# Patient Record
Sex: Female | Born: 1935 | Race: White | Hispanic: No | Smoking: Never smoker
Health system: Southern US, Community
[De-identification: ages and names within clinical notes are randomized; demographics above are authoritative.]

## PROBLEM LIST (undated history)

## (undated) DIAGNOSIS — E119 Type 2 diabetes mellitus without complications: Secondary | ICD-10-CM

## (undated) DIAGNOSIS — Z950 Presence of cardiac pacemaker: Secondary | ICD-10-CM

## (undated) DIAGNOSIS — I10 Essential (primary) hypertension: Secondary | ICD-10-CM

## (undated) DIAGNOSIS — M109 Gout, unspecified: Secondary | ICD-10-CM

## (undated) DIAGNOSIS — E7800 Pure hypercholesterolemia, unspecified: Secondary | ICD-10-CM

## (undated) HISTORY — PX: HIP ARTHROSCOPY: SUR88

---

## 2009-02-02 ENCOUNTER — Ambulatory Visit: Payer: Self-pay | Admitting: Thoracic Surgery (Cardiothoracic Vascular Surgery)

## 2009-02-16 ENCOUNTER — Ambulatory Visit: Payer: Self-pay | Admitting: Thoracic Surgery (Cardiothoracic Vascular Surgery)

## 2009-02-23 ENCOUNTER — Encounter: Payer: Self-pay | Admitting: Thoracic Surgery (Cardiothoracic Vascular Surgery)

## 2009-02-25 ENCOUNTER — Encounter: Payer: Self-pay | Admitting: Thoracic Surgery (Cardiothoracic Vascular Surgery)

## 2009-02-25 ENCOUNTER — Ambulatory Visit: Payer: Self-pay | Admitting: Thoracic Surgery (Cardiothoracic Vascular Surgery)

## 2009-02-25 ENCOUNTER — Inpatient Hospital Stay (HOSPITAL_COMMUNITY)
Admission: RE | Admit: 2009-02-25 | Discharge: 2009-03-06 | Payer: Self-pay | Admitting: Thoracic Surgery (Cardiothoracic Vascular Surgery)

## 2009-02-25 ENCOUNTER — Ambulatory Visit: Payer: Self-pay | Admitting: Internal Medicine

## 2009-03-30 ENCOUNTER — Encounter
Admission: RE | Admit: 2009-03-30 | Discharge: 2009-03-30 | Payer: Self-pay | Admitting: Thoracic Surgery (Cardiothoracic Vascular Surgery)

## 2009-03-30 ENCOUNTER — Ambulatory Visit: Payer: Self-pay | Admitting: Thoracic Surgery (Cardiothoracic Vascular Surgery)

## 2009-07-13 ENCOUNTER — Ambulatory Visit: Payer: Self-pay | Admitting: Thoracic Surgery (Cardiothoracic Vascular Surgery)

## 2009-10-12 ENCOUNTER — Ambulatory Visit: Payer: Self-pay | Admitting: Thoracic Surgery (Cardiothoracic Vascular Surgery)

## 2010-03-08 ENCOUNTER — Ambulatory Visit: Payer: Self-pay | Admitting: Thoracic Surgery (Cardiothoracic Vascular Surgery)

## 2010-04-10 ENCOUNTER — Emergency Department (HOSPITAL_BASED_OUTPATIENT_CLINIC_OR_DEPARTMENT_OTHER): Admission: EM | Admit: 2010-04-10 | Discharge: 2010-04-10 | Payer: Self-pay | Admitting: Emergency Medicine

## 2010-04-10 ENCOUNTER — Ambulatory Visit: Payer: Self-pay | Admitting: Interventional Radiology

## 2010-07-09 ENCOUNTER — Emergency Department (HOSPITAL_BASED_OUTPATIENT_CLINIC_OR_DEPARTMENT_OTHER): Admission: EM | Admit: 2010-07-09 | Discharge: 2010-07-09 | Payer: Self-pay | Admitting: Emergency Medicine

## 2010-09-30 IMAGING — CR DG CHEST 2V
2 series · 2 of 2 positions shown · non-contrast
Comparison: Chest 02/26/2009.

CLINICAL DATA: Status post mitral valve replacement.

CHEST - 2 VIEW

[w chest pa]
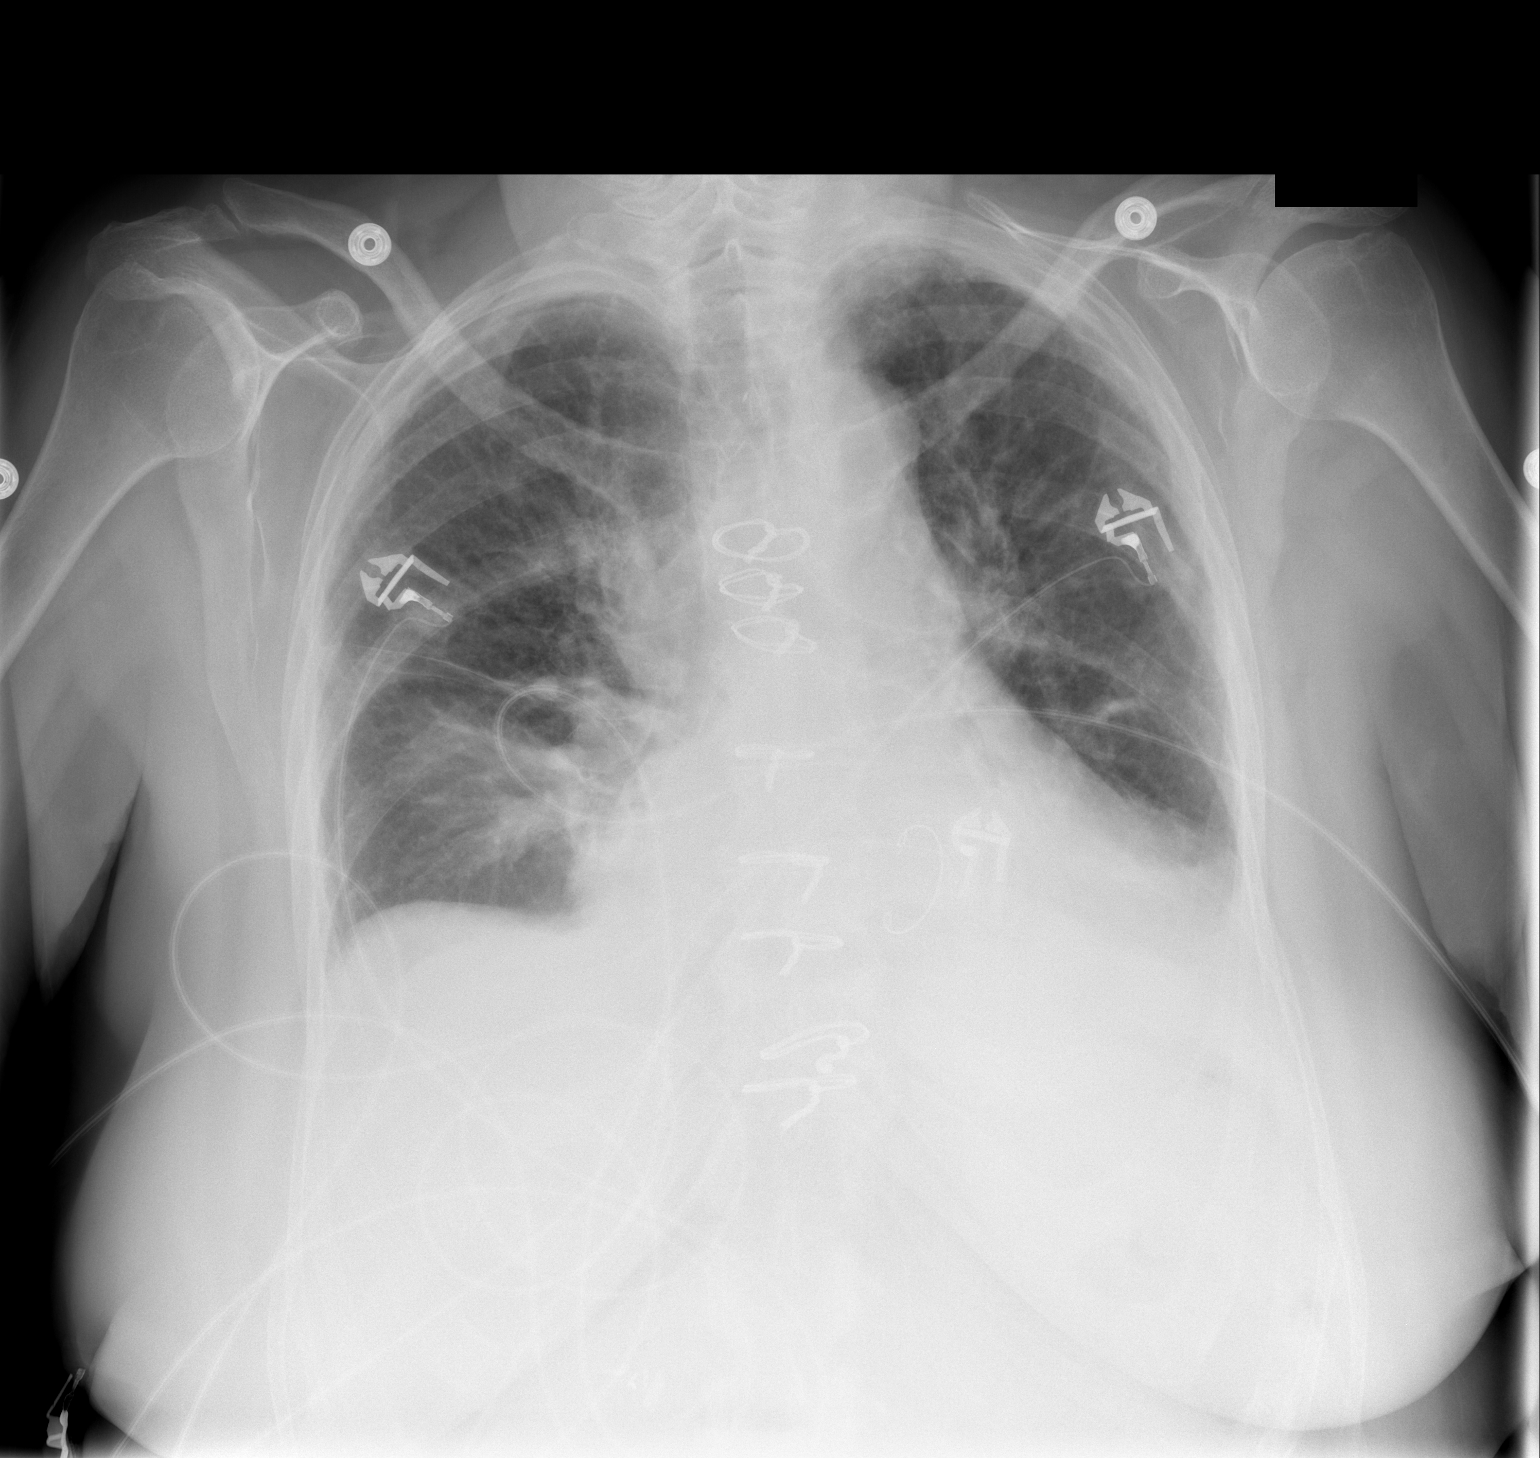

[w chest lat]
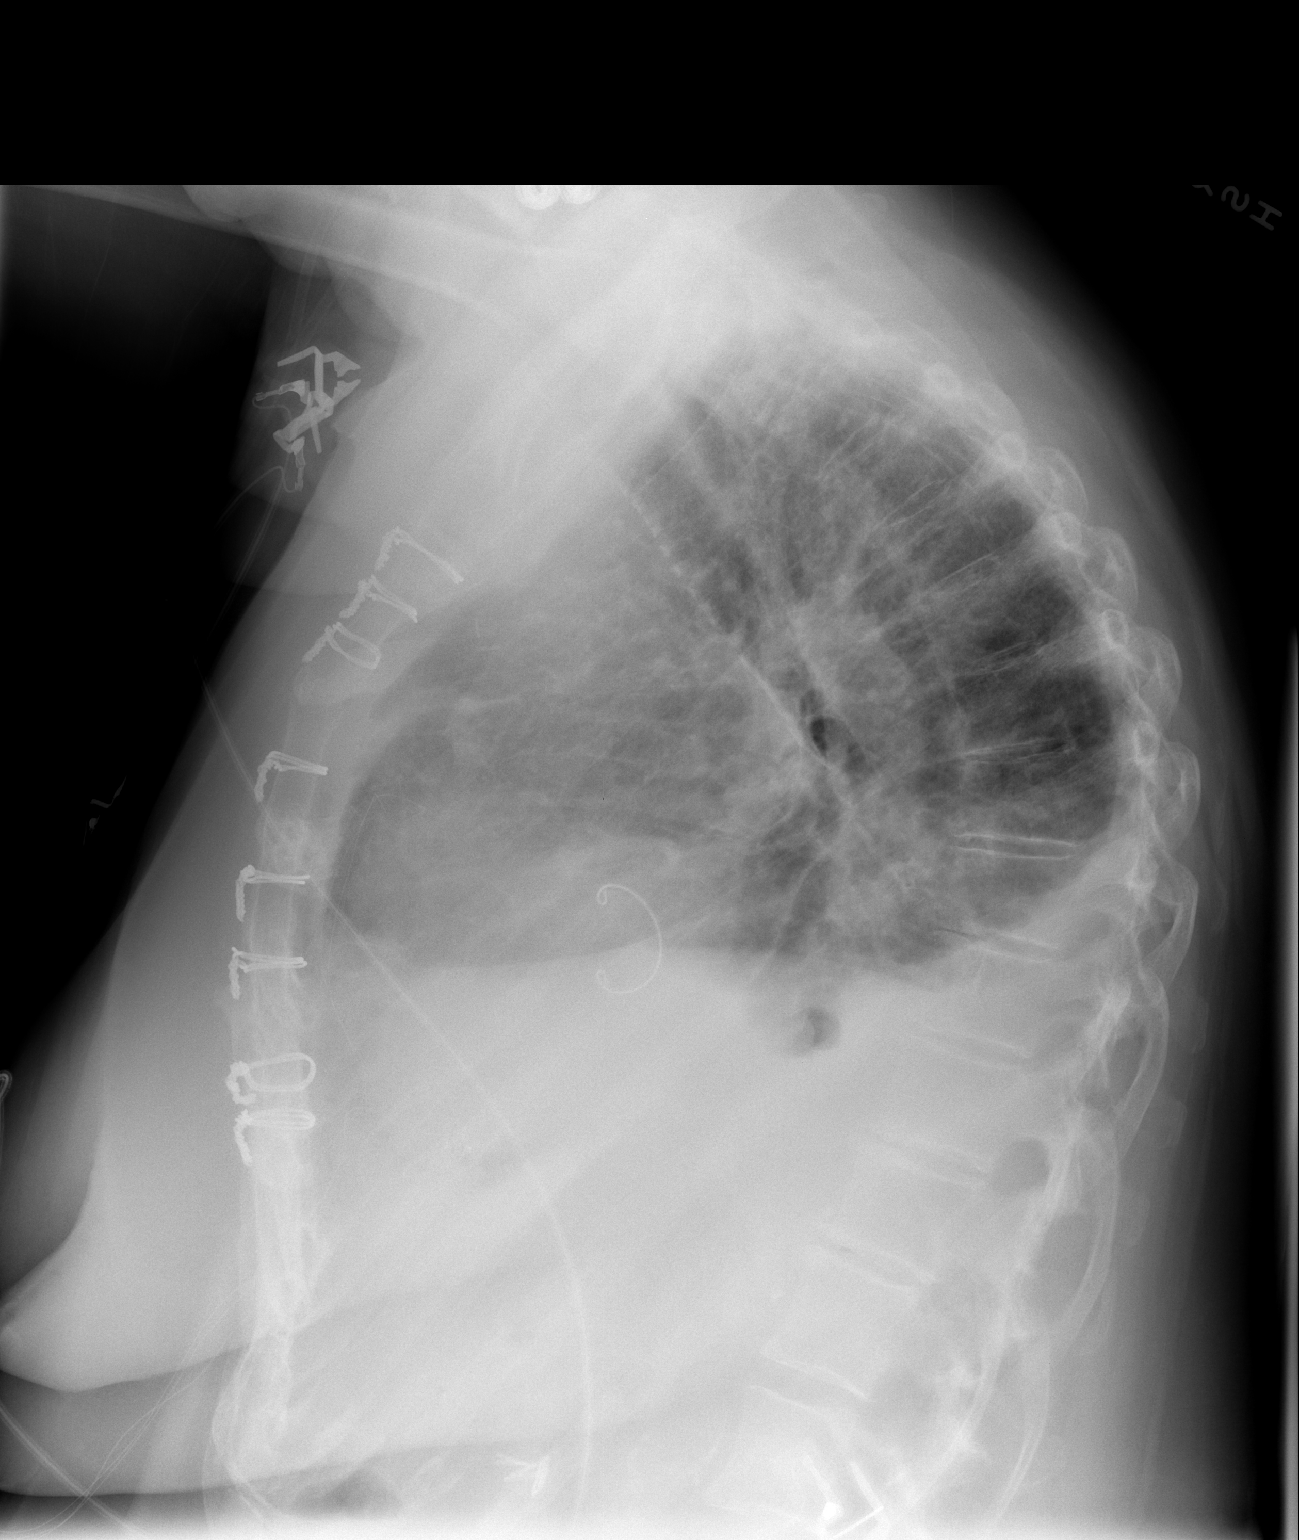

[2 of 2 positions shown; findings below may reference images not displayed]

FINDINGS: Swan-Ganz catheter and chest tubes have been removed.  No
pneumothorax.  Small bilateral pleural effusions and basilar
atelectasis, small bilateral pleural effusions, greater on the left
noted.  There is bibasilar atelectasis.  Cardiomegaly.  No edema.
IMPRESSION: 1.  Support apparatus has been removed.  No pneumothorax.
2.  Bibasilar atelectasis with small pleural effusions, greater on
the left.

## 2010-10-03 IMAGING — CR DG CHEST 2V
2 series · 2 of 2 positions shown · non-contrast
Comparison: 02/27/2009

CLINICAL DATA: Subaortic stenosis, mitral regurgitation.  Evaluate
pleural effusions.

CHEST - 2 VIEW

[w chest pa]
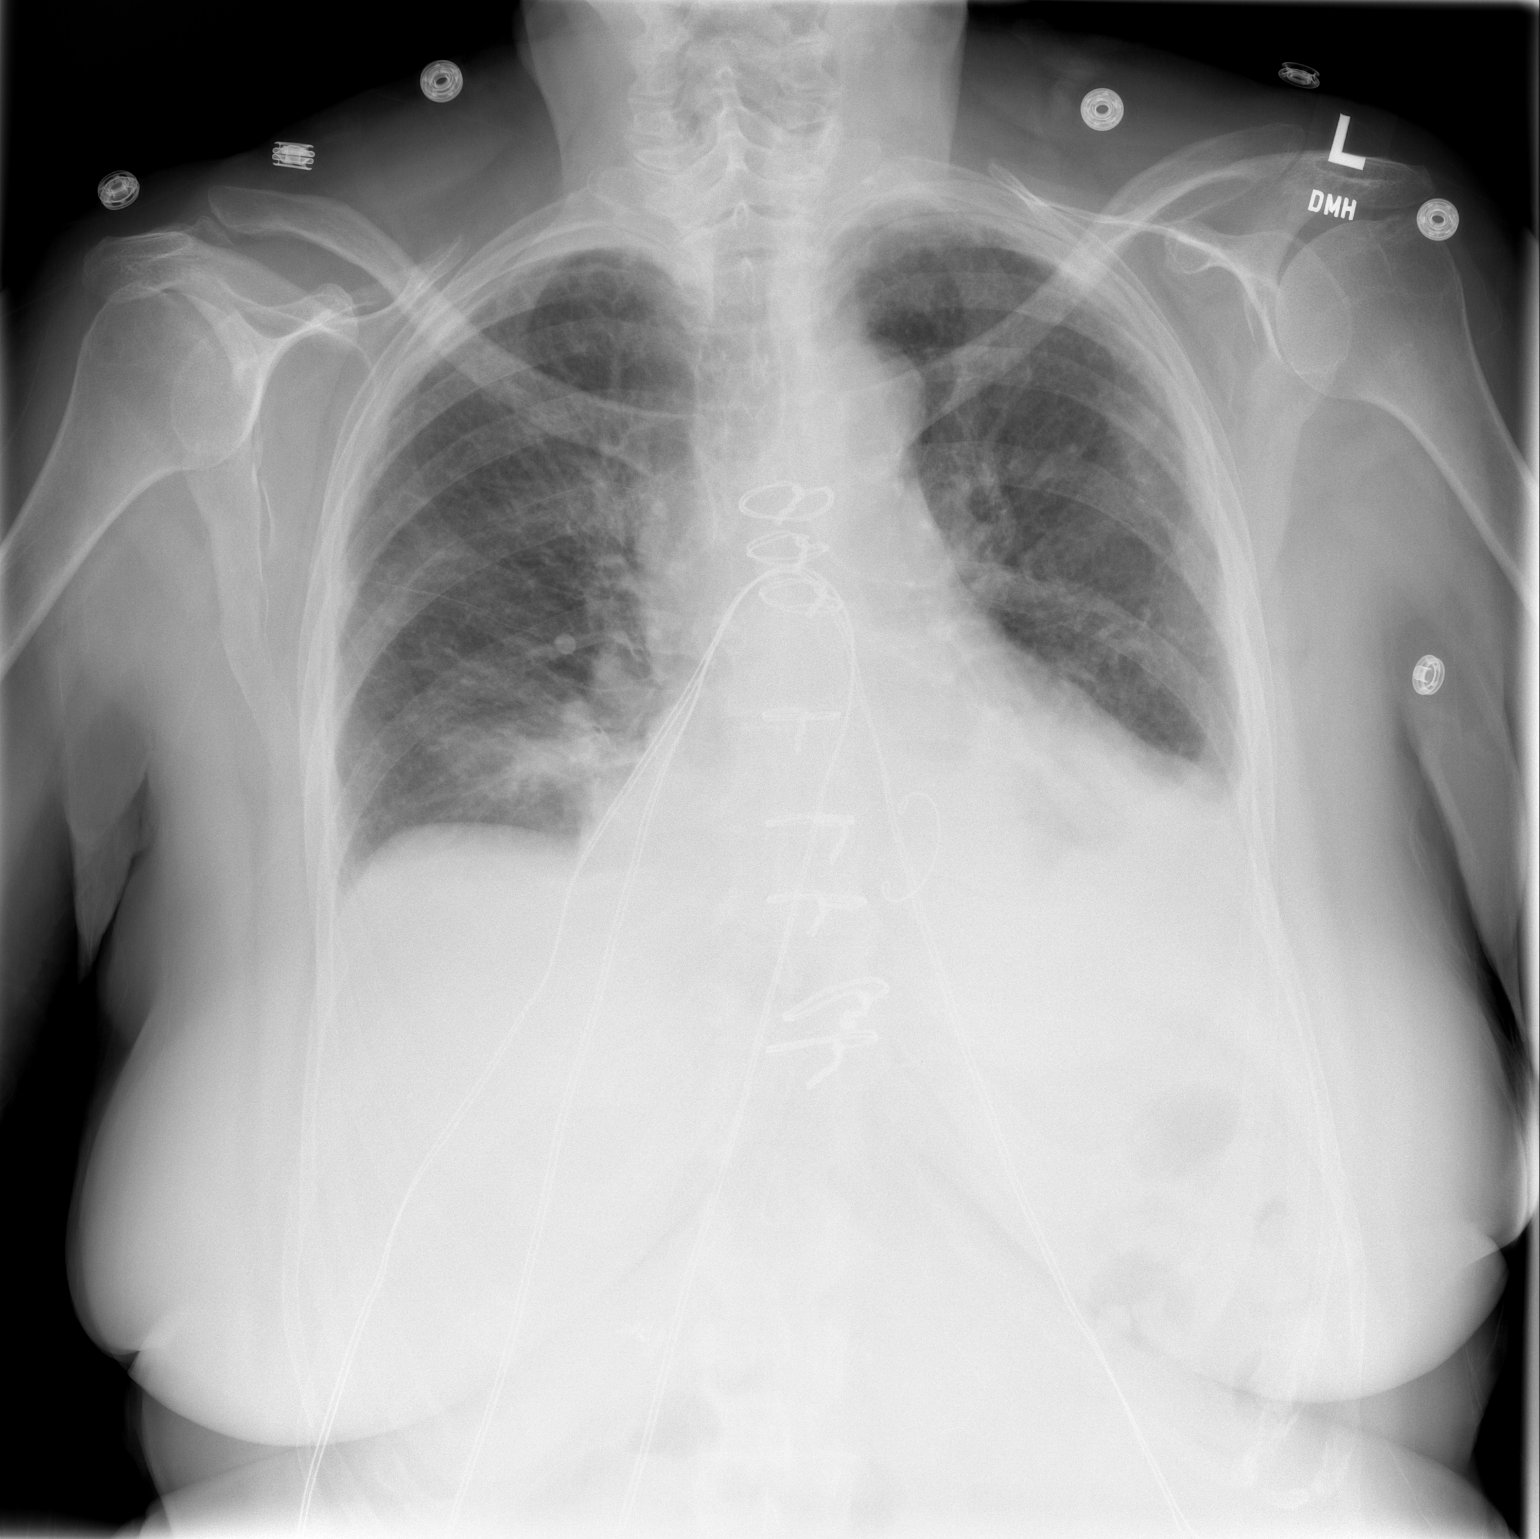

[w chest lat]
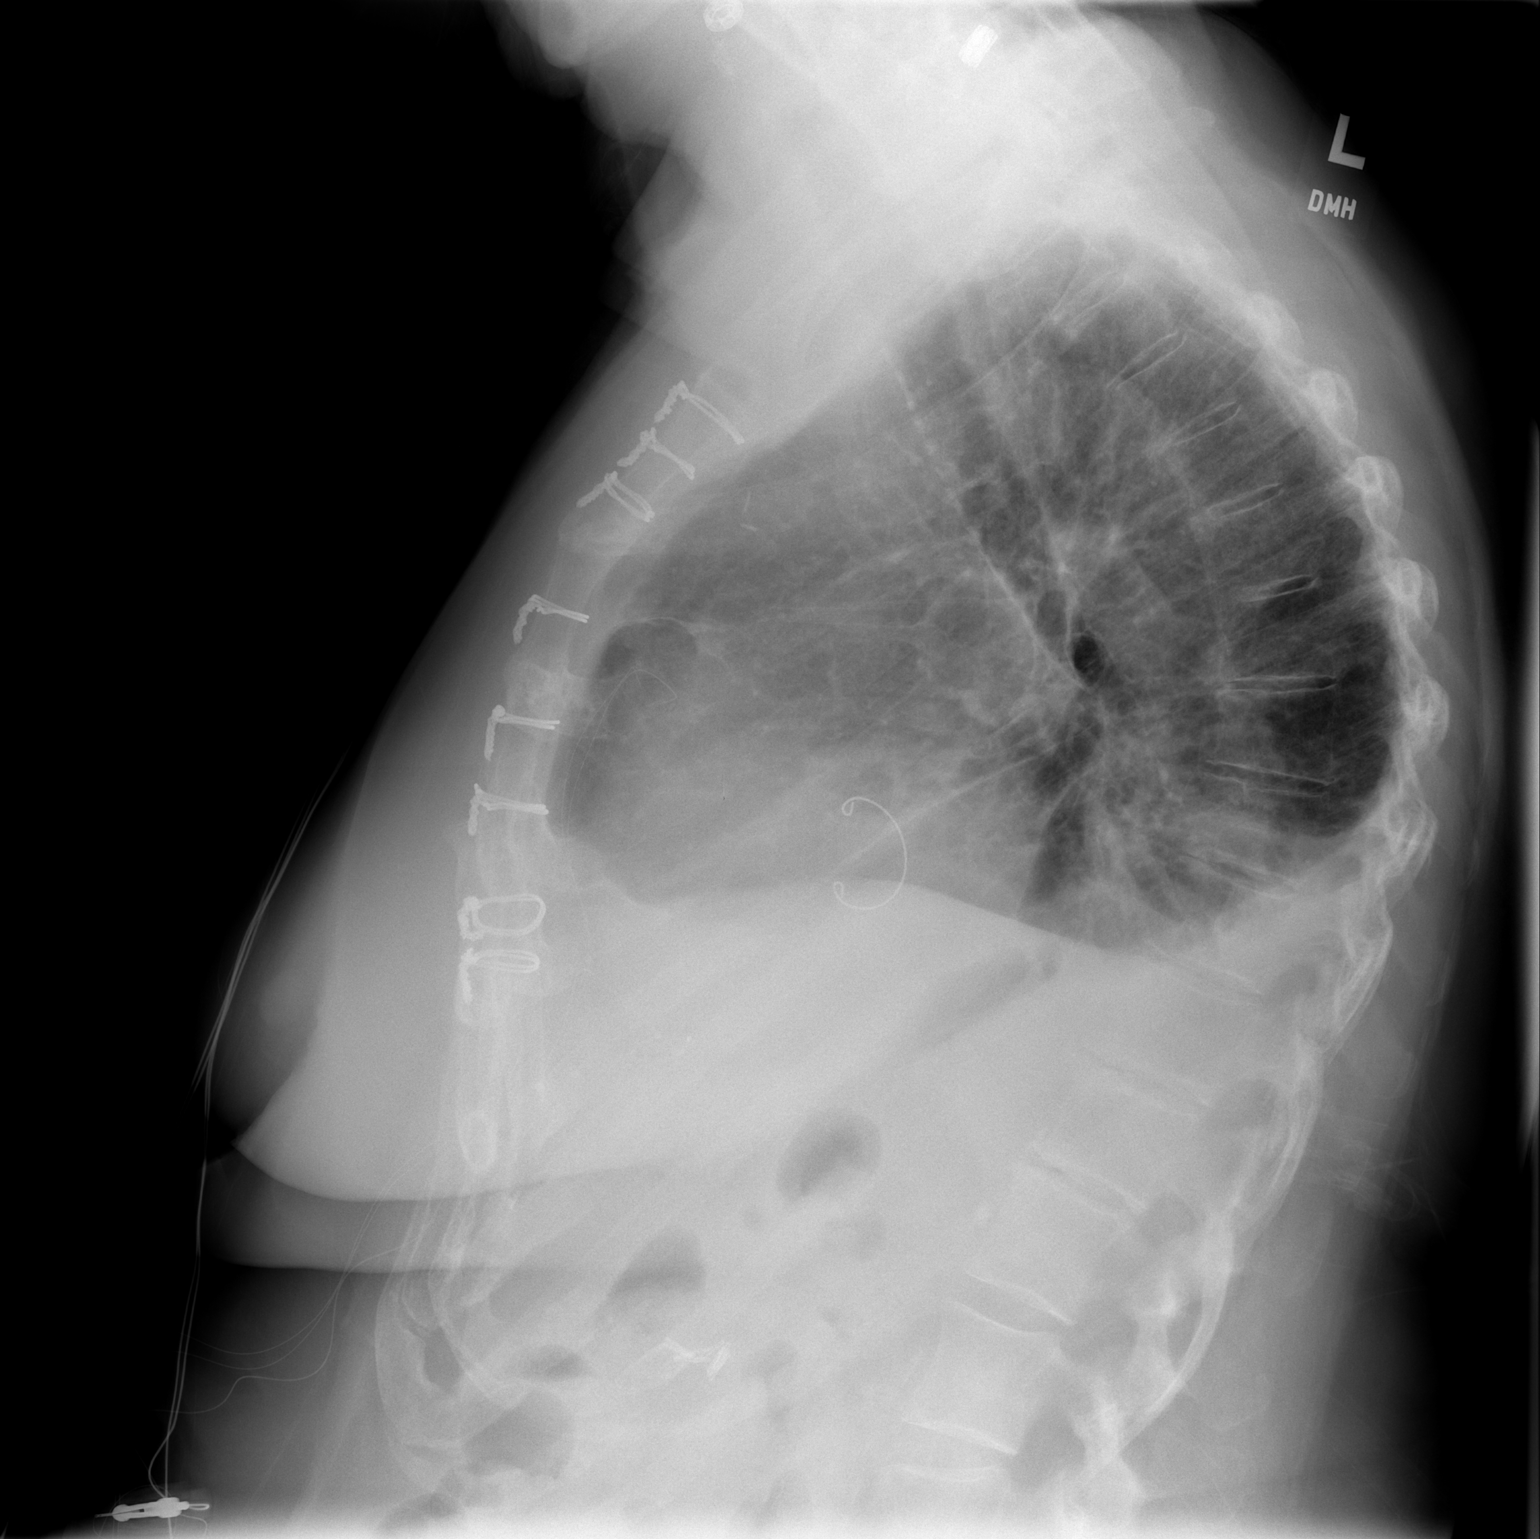

[2 of 2 positions shown; findings below may reference images not displayed]

FINDINGS: Trachea is midline.  Heart is enlarged, stable.  Lungs
are somewhat low in volume with interstitial prominence and
indistinctness.  Small bilateral pleural effusions persist, left
greater than right, with bibasilar air space disease.  Biapical
pleural thickening.  Sternotomy wires are unchanged in position.
IMPRESSION: Congestive heart failure with bibasilar atelectasis.

## 2011-03-07 ENCOUNTER — Encounter (INDEPENDENT_AMBULATORY_CARE_PROVIDER_SITE_OTHER): Payer: Medicare Other | Admitting: Thoracic Surgery (Cardiothoracic Vascular Surgery)

## 2011-03-07 DIAGNOSIS — I4891 Unspecified atrial fibrillation: Secondary | ICD-10-CM

## 2011-03-07 DIAGNOSIS — I059 Rheumatic mitral valve disease, unspecified: Secondary | ICD-10-CM

## 2011-03-07 NOTE — Assessment & Plan Note (Signed)
OFFICE VISIT  Michele, [name] DOB:  March 26, 1925                                        March 07, 2011 CHART #:  [id number]  HISTORY OF PRESENT ILLNESS:  The patient returns for routine followup now 2 years status post septal myomectomy, mitral valve repair, and a maze procedure.  She has continued to do very well from a cardiovascular standpoint.  She has been followed by Dr. Tereso Newcomer, [facility], [facility] in [facility].  She states it is probably been close to a year since her last visit with Dr. Tereso Newcomer.  She states that overall she is doing very well.  She is exercising on a regular basis.  She has no exertional shortness of breath.  She has no dizzy spells.  She has no chest pain.  She has no tachy palpitations.  She is not aware of any sort of cardiovascular issues or problems.  She does remain on Coumadin. The remainder of her review of systems is unremarkable.  PAST MEDICAL HISTORY:  Unchanged with exception of fact that she underwent lumpectomy with axillary lymph node sampling for early stage breast cancer, this past fall.  CURRENT MEDICATIONS: 1. Pravastatin. 2. Glipizide. 3. Lisinopril. 4. Metoprolol. 5. Aspirin. 6. Coumadin.  PHYSICAL EXAMINATION:  A well-appearing female with blood pressure 137/86, pulse 50, oxygen saturation 96% on room air.  HEENT exam is unrevealing.  Auscultation of the chest reveals clear breath sounds that are symmetrical bilaterally.  Cardiovascular exam includes regular rate and rhythm.  No murmurs, rubs, or gallops are noted.  The abdomen is soft and nontender.  The extremities are warm and well perfused.  There is no lower extremity edema.  IMPRESSION:  The patient seems to be doing well 2 years following septal myomectomy, mitral valve repair, and a maze procedure.  She apparently has not had any cardiovascular issues or complications for quite some time.  It would be reasonable to interrogate  her pacemaker to see if she is having any atrial fibrillation.  In the absence of any atrial fibrillation, it would be reasonable to consider stopping her Coumadin.  PLAN:  In the future, the patient will call and return to see [place] as needed.  Salvatore Decent. Cornelius Moras, M.D. Electronically Signed  CHO/MEDQ  D:  03/07/2011  T:  03/07/2011  Job:  [id number]  cc:   Dr. Vick Frees Dr. Sondra Come

## 2011-03-15 LAB — APTT: aPTT: 48 seconds — ABNORMAL HIGH (ref 24–37)

## 2011-03-15 LAB — COMPREHENSIVE METABOLIC PANEL
Calcium: 9.3 mg/dL (ref 8.4–10.5)
Chloride: 109 mEq/L (ref 96–112)
Creatinine, Ser: 1.2 mg/dL (ref 0.4–1.2)
GFR calc non Af Amer: 43 mL/min — ABNORMAL LOW (ref >60)
Glucose, Bld: 82 mg/dL (ref 70–99)
Total Bilirubin: 0.4 mg/dL (ref 0.3–1.2)

## 2011-03-15 LAB — DIFFERENTIAL
Lymphocytes Relative: 26 % (ref 12–46)
Monocytes Absolute: 0.7 10*3/uL (ref 0.1–1.0)
Monocytes Relative: 9 % (ref 3–12)
Neutrophils Relative %: 62 % (ref 43–77)

## 2011-03-15 LAB — CBC
HCT: 38.6 % (ref 36.0–46.0)
Hemoglobin: 12.8 g/dL (ref 12.0–15.0)
MCV: 92.1 fL (ref 78.0–100.0)

## 2011-03-15 LAB — URIC ACID: Uric Acid, Serum: 7.2 mg/dL — ABNORMAL HIGH (ref 2.4–7.0)

## 2011-04-07 LAB — POCT I-STAT 4, (NA,K, GLUC, HGB,HCT)
Glucose, Bld: 102 mg/dL — ABNORMAL HIGH (ref 70–99)
Glucose, Bld: 109 mg/dL — ABNORMAL HIGH (ref 70–99)
Glucose, Bld: 116 mg/dL — ABNORMAL HIGH (ref 70–99)
Glucose, Bld: 139 mg/dL — ABNORMAL HIGH (ref 70–99)
Glucose, Bld: 145 mg/dL — ABNORMAL HIGH (ref 70–99)
Glucose, Bld: 93 mg/dL (ref 70–99)
HCT: 20 % — ABNORMAL LOW (ref 36.0–46.0)
HCT: 26 % — ABNORMAL LOW (ref 36.0–46.0)
HCT: 30 % — ABNORMAL LOW (ref 36.0–46.0)
HCT: 35 % — ABNORMAL LOW (ref 36.0–46.0)
Hemoglobin: 10.5 g/dL — ABNORMAL LOW (ref 12.0–15.0)
Hemoglobin: 6.8 g/dL — CL (ref 12.0–15.0)
Hemoglobin: 8.8 g/dL — ABNORMAL LOW (ref 12.0–15.0)
Potassium: 4.3 mEq/L (ref 3.5–5.1)
Potassium: 4.8 mEq/L (ref 3.5–5.1)
Potassium: 5.3 mEq/L — ABNORMAL HIGH (ref 3.5–5.1)
Potassium: 5.9 mEq/L — ABNORMAL HIGH (ref 3.5–5.1)
Sodium: 132 mEq/L — ABNORMAL LOW (ref 135–145)
Sodium: 138 mEq/L (ref 135–145)
Sodium: 138 mEq/L (ref 135–145)

## 2011-04-07 LAB — PROTIME-INR
INR: 1 (ref 0.00–1.49)
INR: 1.1 (ref 0.00–1.49)
INR: 1.1 (ref 0.00–1.49)
INR: 1.2 (ref 0.00–1.49)
INR: 1.3 (ref 0.00–1.49)
INR: 1.6 — ABNORMAL HIGH (ref 0.00–1.49)
Prothrombin Time: 13.7 seconds (ref 11.6–15.2)
Prothrombin Time: 14.3 seconds (ref 11.6–15.2)
Prothrombin Time: 16.4 seconds — ABNORMAL HIGH (ref 11.6–15.2)

## 2011-04-07 LAB — CBC
HCT: 27.8 % — ABNORMAL LOW (ref 36.0–46.0)
HCT: 29 % — ABNORMAL LOW (ref 36.0–46.0)
HCT: 29.1 % — ABNORMAL LOW (ref 36.0–46.0)
HCT: 29.9 % — ABNORMAL LOW (ref 36.0–46.0)
Hemoglobin: 10 g/dL — ABNORMAL LOW (ref 12.0–15.0)
Hemoglobin: 10.2 g/dL — ABNORMAL LOW (ref 12.0–15.0)
Hemoglobin: 9.4 g/dL — ABNORMAL LOW (ref 12.0–15.0)
Hemoglobin: 9.8 g/dL — ABNORMAL LOW (ref 12.0–15.0)
Hemoglobin: 9.8 g/dL — ABNORMAL LOW (ref 12.0–15.0)
MCHC: 33.7 g/dL (ref 30.0–36.0)
MCHC: 33.7 g/dL (ref 30.0–36.0)
MCHC: 33.9 g/dL (ref 30.0–36.0)
MCHC: 34 g/dL (ref 30.0–36.0)
MCHC: 34.2 g/dL (ref 30.0–36.0)
MCV: 84.3 fL (ref 78.0–100.0)
MCV: 86.2 fL (ref 78.0–100.0)
MCV: 86.3 fL (ref 78.0–100.0)
MCV: 86.4 fL (ref 78.0–100.0)
MCV: 87.3 fL (ref 78.0–100.0)
MCV: 87.6 fL (ref 78.0–100.0)
Platelets: 104 10*3/uL — ABNORMAL LOW (ref 150–400)
Platelets: 105 10*3/uL — ABNORMAL LOW (ref 150–400)
Platelets: 155 10*3/uL (ref 150–400)
Platelets: 94 10*3/uL — ABNORMAL LOW (ref 150–400)
Platelets: 97 10*3/uL — ABNORMAL LOW (ref 150–400)
RBC: 3.22 MIL/uL — ABNORMAL LOW (ref 3.87–5.11)
RBC: 3.33 MIL/uL — ABNORMAL LOW (ref 3.87–5.11)
RBC: 3.37 MIL/uL — ABNORMAL LOW (ref 3.87–5.11)
RBC: 3.38 MIL/uL — ABNORMAL LOW (ref 3.87–5.11)
RBC: 3.5 MIL/uL — ABNORMAL LOW (ref 3.87–5.11)
RBC: 4.31 MIL/uL (ref 3.87–5.11)
RDW: 20.5 % — ABNORMAL HIGH (ref 11.5–15.5)
RDW: 21 % — ABNORMAL HIGH (ref 11.5–15.5)
RDW: 21.2 % — ABNORMAL HIGH (ref 11.5–15.5)
RDW: 21.5 % — ABNORMAL HIGH (ref 11.5–15.5)
WBC: 6.2 10*3/uL (ref 4.0–10.5)
WBC: 9 10*3/uL (ref 4.0–10.5)
WBC: 9.3 10*3/uL (ref 4.0–10.5)
WBC: 9.5 10*3/uL (ref 4.0–10.5)

## 2011-04-07 LAB — GLUCOSE, CAPILLARY
Glucose-Capillary: 100 mg/dL — ABNORMAL HIGH (ref 70–99)
Glucose-Capillary: 106 mg/dL — ABNORMAL HIGH (ref 70–99)
Glucose-Capillary: 106 mg/dL — ABNORMAL HIGH (ref 70–99)
Glucose-Capillary: 110 mg/dL — ABNORMAL HIGH (ref 70–99)
Glucose-Capillary: 116 mg/dL — ABNORMAL HIGH (ref 70–99)
Glucose-Capillary: 120 mg/dL — ABNORMAL HIGH (ref 70–99)
Glucose-Capillary: 121 mg/dL — ABNORMAL HIGH (ref 70–99)
Glucose-Capillary: 121 mg/dL — ABNORMAL HIGH (ref 70–99)
Glucose-Capillary: 128 mg/dL — ABNORMAL HIGH (ref 70–99)
Glucose-Capillary: 131 mg/dL — ABNORMAL HIGH (ref 70–99)
Glucose-Capillary: 132 mg/dL — ABNORMAL HIGH (ref 70–99)
Glucose-Capillary: 136 mg/dL — ABNORMAL HIGH (ref 70–99)
Glucose-Capillary: 136 mg/dL — ABNORMAL HIGH (ref 70–99)
Glucose-Capillary: 137 mg/dL — ABNORMAL HIGH (ref 70–99)
Glucose-Capillary: 139 mg/dL — ABNORMAL HIGH (ref 70–99)
Glucose-Capillary: 143 mg/dL — ABNORMAL HIGH (ref 70–99)
Glucose-Capillary: 145 mg/dL — ABNORMAL HIGH (ref 70–99)
Glucose-Capillary: 150 mg/dL — ABNORMAL HIGH (ref 70–99)
Glucose-Capillary: 155 mg/dL — ABNORMAL HIGH (ref 70–99)
Glucose-Capillary: 170 mg/dL — ABNORMAL HIGH (ref 70–99)
Glucose-Capillary: 174 mg/dL — ABNORMAL HIGH (ref 70–99)
Glucose-Capillary: 174 mg/dL — ABNORMAL HIGH (ref 70–99)
Glucose-Capillary: 174 mg/dL — ABNORMAL HIGH (ref 70–99)
Glucose-Capillary: 182 mg/dL — ABNORMAL HIGH (ref 70–99)
Glucose-Capillary: 187 mg/dL — ABNORMAL HIGH (ref 70–99)
Glucose-Capillary: 204 mg/dL — ABNORMAL HIGH (ref 70–99)
Glucose-Capillary: 205 mg/dL — ABNORMAL HIGH (ref 70–99)
Glucose-Capillary: 215 mg/dL — ABNORMAL HIGH (ref 70–99)
Glucose-Capillary: 84 mg/dL (ref 70–99)
Glucose-Capillary: 86 mg/dL (ref 70–99)
Glucose-Capillary: 94 mg/dL (ref 70–99)
Glucose-Capillary: 99 mg/dL (ref 70–99)

## 2011-04-07 LAB — COMPREHENSIVE METABOLIC PANEL
AST: 21 U/L (ref 0–37)
Calcium: 9.1 mg/dL (ref 8.4–10.5)
Creatinine, Ser: 1.34 mg/dL — ABNORMAL HIGH (ref 0.4–1.2)
GFR calc Af Amer: 46 mL/min — ABNORMAL LOW (ref >60)
Glucose, Bld: 101 mg/dL — ABNORMAL HIGH (ref 70–99)
Potassium: 4.6 mEq/L (ref 3.5–5.1)
Sodium: 140 mEq/L (ref 135–145)

## 2011-04-07 LAB — POCT I-STAT 3, VENOUS BLOOD GAS (G3P V)
Acid-base deficit: 3 mmol/L — ABNORMAL HIGH (ref 0.0–2.0)
O2 Saturation: 85 %
pCO2, Ven: 38.5 mmHg — ABNORMAL LOW (ref 45.0–50.0)

## 2011-04-07 LAB — POCT I-STAT 3, ART BLOOD GAS (G3+)
Acid-base deficit: 3 mmol/L — ABNORMAL HIGH (ref 0.0–2.0)
Bicarbonate: 23.5 mEq/L (ref 20.0–24.0)
Bicarbonate: 24.4 mEq/L — ABNORMAL HIGH (ref 20.0–24.0)
Bicarbonate: 26.2 mEq/L — ABNORMAL HIGH (ref 20.0–24.0)
O2 Saturation: 100 %
O2 Saturation: 100 %
O2 Saturation: 100 %
Patient temperature: 35.1
TCO2: 24 mmol/L (ref 0–100)
TCO2: 25 mmol/L (ref 0–100)
TCO2: 25 mmol/L (ref 0–100)
TCO2: 26 mmol/L (ref 0–100)
TCO2: 28 mmol/L (ref 0–100)
pCO2 arterial: 37.4 mmHg (ref 35.0–45.0)
pCO2 arterial: 38.1 mmHg (ref 35.0–45.0)
pH, Arterial: 7.379 (ref 7.350–7.400)
pO2, Arterial: 492 mmHg — ABNORMAL HIGH (ref 80.0–100.0)
pO2, Arterial: 95 mmHg (ref 80.0–100.0)

## 2011-04-07 LAB — POCT I-STAT, CHEM 8
BUN: 15 mg/dL (ref 6–23)
Calcium, Ion: 1.12 mmol/L (ref 1.12–1.32)
Chloride: 106 mEq/L (ref 96–112)
Creatinine, Ser: 1.2 mg/dL (ref 0.4–1.2)
Glucose, Bld: 148 mg/dL — ABNORMAL HIGH (ref 70–99)
HCT: 30 % — ABNORMAL LOW (ref 36.0–46.0)
Hemoglobin: 10.2 g/dL — ABNORMAL LOW (ref 12.0–15.0)
Potassium: 4.7 mEq/L (ref 3.5–5.1)
Sodium: 139 mEq/L (ref 135–145)
TCO2: 24 mmol/L (ref 0–100)

## 2011-04-07 LAB — BASIC METABOLIC PANEL
BUN: 14 mg/dL (ref 6–23)
BUN: 21 mg/dL (ref 6–23)
BUN: 24 mg/dL — ABNORMAL HIGH (ref 6–23)
BUN: 33 mg/dL — ABNORMAL HIGH (ref 6–23)
BUN: 38 mg/dL — ABNORMAL HIGH (ref 6–23)
BUN: 40 mg/dL — ABNORMAL HIGH (ref 6–23)
BUN: 42 mg/dL — ABNORMAL HIGH (ref 6–23)
CO2: 24 mEq/L (ref 19–32)
CO2: 28 mEq/L (ref 19–32)
CO2: 30 mEq/L (ref 19–32)
CO2: 31 mEq/L (ref 19–32)
CO2: 32 mEq/L (ref 19–32)
Calcium: 7.6 mg/dL — ABNORMAL LOW (ref 8.4–10.5)
Calcium: 8.8 mg/dL (ref 8.4–10.5)
Calcium: 9.5 mg/dL (ref 8.4–10.5)
Chloride: 100 mEq/L (ref 96–112)
Chloride: 102 mEq/L (ref 96–112)
Chloride: 102 mEq/L (ref 96–112)
Chloride: 106 mEq/L (ref 96–112)
Chloride: 97 mEq/L (ref 96–112)
Chloride: 99 mEq/L (ref 96–112)
Creatinine, Ser: 1.25 mg/dL — ABNORMAL HIGH (ref 0.4–1.2)
Creatinine, Ser: 1.35 mg/dL — ABNORMAL HIGH (ref 0.4–1.2)
Creatinine, Ser: 1.61 mg/dL — ABNORMAL HIGH (ref 0.4–1.2)
Creatinine, Ser: 1.74 mg/dL — ABNORMAL HIGH (ref 0.4–1.2)
GFR calc Af Amer: 32 mL/min — ABNORMAL LOW (ref >60)
GFR calc Af Amer: 37 mL/min — ABNORMAL LOW (ref >60)
GFR calc Af Amer: 46 mL/min — ABNORMAL LOW (ref >60)
GFR calc non Af Amer: 26 mL/min — ABNORMAL LOW (ref >60)
GFR calc non Af Amer: 28 mL/min — ABNORMAL LOW (ref >60)
GFR calc non Af Amer: 31 mL/min — ABNORMAL LOW (ref >60)
GFR calc non Af Amer: 38 mL/min — ABNORMAL LOW (ref >60)
GFR calc non Af Amer: 38 mL/min — ABNORMAL LOW (ref >60)
Glucose, Bld: 114 mg/dL — ABNORMAL HIGH (ref 70–99)
Glucose, Bld: 115 mg/dL — ABNORMAL HIGH (ref 70–99)
Glucose, Bld: 141 mg/dL — ABNORMAL HIGH (ref 70–99)
Glucose, Bld: 146 mg/dL — ABNORMAL HIGH (ref 70–99)
Glucose, Bld: 78 mg/dL (ref 70–99)
Potassium: 3.7 mEq/L (ref 3.5–5.1)
Potassium: 3.8 mEq/L (ref 3.5–5.1)
Potassium: 3.9 mEq/L (ref 3.5–5.1)
Potassium: 4 mEq/L (ref 3.5–5.1)
Potassium: 4 mEq/L (ref 3.5–5.1)
Potassium: 4.4 mEq/L (ref 3.5–5.1)
Potassium: 5.1 mEq/L (ref 3.5–5.1)
Sodium: 136 mEq/L (ref 135–145)
Sodium: 138 mEq/L (ref 135–145)
Sodium: 138 mEq/L (ref 135–145)
Sodium: 139 mEq/L (ref 135–145)
Sodium: 140 mEq/L (ref 135–145)

## 2011-04-07 LAB — URINALYSIS, ROUTINE W REFLEX MICROSCOPIC
Hgb urine dipstick: NEGATIVE
Specific Gravity, Urine: 1.021 (ref 1.005–1.030)
Urobilinogen, UA: 0.2 mg/dL (ref 0.0–1.0)
pH: 5.5 (ref 5.0–8.0)

## 2011-04-07 LAB — TYPE AND SCREEN: ABO/RH(D): O POS

## 2011-04-07 LAB — BLOOD GAS, ARTERIAL
Acid-base deficit: 2.2 mmol/L — ABNORMAL HIGH (ref 0.0–2.0)
TCO2: 22.8 mmol/L (ref 0–100)
pO2, Arterial: 84.5 mmHg (ref 80.0–100.0)

## 2011-04-07 LAB — PREPARE FRESH FROZEN PLASMA

## 2011-04-07 LAB — HEMOGLOBIN AND HEMATOCRIT, BLOOD
HCT: 18.4 % — ABNORMAL LOW (ref 36.0–46.0)
Hemoglobin: 6.3 g/dL — CL (ref 12.0–15.0)

## 2011-04-07 LAB — PREPARE PLATELETS

## 2011-04-07 LAB — HEMOGLOBIN A1C
Hgb A1c MFr Bld: 6.3 % — ABNORMAL HIGH (ref 4.6–6.1)
Mean Plasma Glucose: 134 mg/dL

## 2011-04-07 LAB — POCT I-STAT GLUCOSE: Glucose, Bld: 110 mg/dL — ABNORMAL HIGH (ref 70–99)

## 2011-04-07 LAB — MAGNESIUM: Magnesium: 2.6 mg/dL — ABNORMAL HIGH (ref 1.5–2.5)

## 2011-04-07 LAB — APTT: aPTT: 34 seconds (ref 24–37)

## 2011-04-07 LAB — CREATININE, SERUM: GFR calc non Af Amer: 48 mL/min — ABNORMAL LOW (ref >60)

## 2011-04-07 LAB — PLATELET COUNT: Platelets: 82 10*3/uL — ABNORMAL LOW (ref 150–400)

## 2011-05-10 NOTE — Assessment & Plan Note (Signed)
OFFICE VISIT   [name], [name]  DOB:  07-24-1925                                        October 12, 2009  CHART #:  [id number]   HISTORY:  The patient returned to the office today for further followup  now 7 months status post mitral valve repair, septal myomectomy, and  modified Cox-Maze procedure.  She was last seen here in the office on  July 13, 2009.  Since then, she has continued to do well.  She is now  off amiodarone.  She remains on Coumadin.  She was seen recently by Dr.  Tereso Newcomer and restarted on oral Lasix for mild volume overload.  She  feels quite well.  Her exercise tolerance is good.  She denies any  problems with shortness of breath.  She has not had any dizzy spells.  She has not had any tachy palpitations.  The remainder of her review of  systems is entirely unrevealing.   CURRENT MEDICATIONS:  Pravastatin, glipizide, iron supplement,  lisinopril, Coumadin, metoprolol, Lasix, aspirin.   PHYSICAL EXAMINATION:  Notable for well-appearing female with blood  pressure 140/84, pulse 82 and regular.  Two-channel telemetry rhythm  strip demonstrates paced rhythm.  Oxygen saturations 94% on room air.  Examination of the chest reveals a well-healed median sternotomy scar.  The sternum is stable on palpation.  Breath sounds are clear to  auscultation and symmetrical bilaterally.  Cardiovascular exam reveals  regular rate and rhythm.  No murmurs, rubs, or gallops are noted.  The  abdomen is soft, nontender.  The extremities are warm and well perfused.  There is mild bilateral lower extremity edema.   IMPRESSION:  Satisfactory progress now more than 6 months status post  septal myomectomy, mitral valve repair, and a maze procedure.  The  patient seems to be doing well.  She is now off amiodarone and presently  appears to be maintaining a paced rhythm.  There is no history of any  clinically significant atrial fibrillation, although her pacemaker  has  not yet been interrogated.  She reports that it is scheduled to be  interrogated next month.   PLAN:  We will continue to defer any long-term management regarding her  medical therapy to Dr. Tereso Newcomer and colleagues.  Ultimately, if  pacemaker interrogation demonstrates the absence of any significant  episodes of atrial fibrillation, at some point she could be taken off  Coumadin.   [name], M.D.  Electronically Signed   CHO/MEDQ  D:  10/12/2009  T:  10/12/2009  Job:  [id number]   cc:   Rosezena Sensor. Al-Khori, MD  Sondra Come, MD

## 2011-05-10 NOTE — Assessment & Plan Note (Signed)
OFFICE VISIT   RAYMOND, AZURE  DOB:  07-26-1925                                        July 13, 2009  CHART #:  [id number]   The patient returns to the office today for routine followup and rhythm  check, now 3 months status post septal myomectomy, mitral valve repair,  and modified Cox-maze procedure on February 25, 2009.  She was last seen  here in the office on March 30, 2009.  Since then, she has continued to  do quite well.  She has not been back to see Dr. Tereso Newcomer over the last  couple of months, although she does report that she just received the  device for remote interrogation of her pacemaker.  She reports that she  feels well.  She has no shortness of breath.  She has no problems with  her sternotomy.  Her activity level is good.  Her appetite is good.  She  has had some intermittent difficulty swallowing over the last couple of  weeks and she is scheduled to see a ear, nose, and throat specialist to  have this evaluated further.  She is eating fairly well, but reports  that intermittently she has some difficulty both with swallowing liquids  and solids.  This is inconsistent and has only recently developed.  She  has no pain with swallowing.  She denies any fevers or chills.  The  remainder of her review of systems is unremarkable.  The remainder of  her past medical history is unchanged.   Current medications include pravastatin, glipizide, lisinopril,  Jantoven, metoprolol, amiodarone, and iron supplement.   PHYSICAL EXAMINATION:  GENERAL:  A well-appearing female.  VITAL SIGNS:  Blood pressure 136/80, pulse is 80 and regular, and two-  channel telemetry rhythm strip demonstrates AV-paced rhythm.  Oxygen  saturation 96% on room air.  CHEST:  Her median sternotomy incision has healed nicely and the sternum  is stable on palpation.  LUNGS:  Auscultation of the chest demonstrates clear breath sounds which  are symmetrical.  No wheezes or rhonchi  are noted.  CARDIOVASCULAR:  Regular rate and rhythm.  No murmurs, rubs, or gallops  are noted.  ABDOMEN:  Soft and nontender.  EXTREMITIES:  Warm and well perfused.  There is trace bilateral lower  extremity edema.   IMPRESSION:  The patient appears to be doing quite well.  Her rhythm  appears atrioventricularly paced here in the office today and she has no  symptoms or signs to suggest any problems with recurrent atrial  fibrillation.  She has not yet had her pacemaker interrogated to check  her rhythm.  She is clinically otherwise doing quite well with only  recently having developed some mild dysphagia for which she plans to  seek further evaluation.   PLAN:  I have suggested to the patient that she go ahead and stop taking  amiodarone.  I think she could probably come off of Coumadin if her  rhythm remains stable off of amiodarone and there is no sign of  significant occult atrial fibrillation on interrogation of her permanent  pacemaker.  At some point, a followup echocardiogram would be  reasonable.  I do not hear a murmur on exam and clinically she is doing  well.  We will plan to have the patient return for routine followup and  rhythm check in 3 months.   Salvatore Decent. Cornelius Moras, M.D.  Electronically Signed   CHO/MEDQ  D:  07/13/2009  T:  07/14/2009  Job:  [id number]   cc:   Vick Frees, MD  Karie Soda Joseph Art, M.D.

## 2011-05-10 NOTE — Consult Note (Signed)
NEW PATIENT CONSULTATION   [name], [name]  DOB:  1925-08-23                                        February 02, 2009  CHART #:  [id number]   REASON FOR CONSULTATION:  Subvalvular left ventricular outflow tract  obstruction and mitral regurgitation.   HISTORY OF PRESENT ILLNESS:  The patient is an 77 year old widowed white  female from [facility] who has been followed by Dr. Tereso Newcomer for many  years.  The patient states that she has been treated for hypertension  for a long time and more recently, she has been aware of the presence of  a heart murmur.  Other medical problems include hypertension, type 2  diabetes mellitus, hyperlipidemia, and iron-deficient anemia.  The  patient underwent left heart catheterization in April 2009 demonstrating  moderate, nonobstructive coronary artery disease.  Left ventricular  function was normal and there was 40-45% stenosis of the right coronary  artery appreciated at that time.  An echocardiogram performed in  September 2009 revealed normal left ventricular systolic function with  moderate left ventricular hypertrophy and significant diastolic  dysfunction.  There was severe dynamic left ventricular outflow tract  obstruction with a peak gradient of 212 mmHg at rest.  There was also  systolic anterior motion of the anterior leaf of the mitral valve with  moderate-to-severe mitral regurgitation.  The exam was felt to be  consistent with moderate pulmonary hypertension based upon right-sided  pressure gradient.  These findings were all fairly similar to an  echocardiogram performed in 2008.  Cardiac MRA was performed confirming  the presence of significant asymmetric septal hypertrophy with left  ventricular dynamic outflow tract obstruction and mitral regurgitation.  Over this period of time, the patient has developed progressive symptoms  of exertional shortness of breath as well as occasional transient  episodes of severe  generalized weakness and lightheadedness.  She has  never had any frank syncopal episodes.  She has never had any chest  pain.  She has been referred for possible elective surgical  intervention.  More recently, the patient developed paroxysmal atrial  fibrillation that was newly discovered on routine followup  electrocardiogram in November 2009.  Since then, she has been treated  with Coumadin.   REVIEW OF SYSTEMS:  General:  The patient reports normal appetite.  She  has not been gaining nor losing weight recently.  She is 5 feet 6 inches  tall, weighs approximately 200 pounds.  She does report progressive  symptoms of exertional shortness of breath and orthopnea, all of which  has slowly worsened over the last 2-3 years.  The patient denies resting  shortness of breath, chest pain, chest tightness, or chest pressure.  The patient had some bilateral lower extremity edema that had been a  little bit worse recently.  Respiratory:  Notable only for shortness of  breath.  The patient denies productive cough, hemoptysis, wheezing.  Gastrointestinal:  Negative.  The patient reports no difficulty  swallowing.  She denies hematochezia, hematemesis, and melena.  Genitourinary:  Negative.  The patient denies urinary urgency or  frequency.  Peripheral Vascular:  Negative.  The patient denies pain in  her legs suggestive of claudication.  Neurologic:  Notable for episodes  of transient dizziness and severe generalized weakness that seem to come  and go sporadically and are not necessarily related to  physical  activity.  The patient has never had a frank syncopal episode.  Musculoskeletal:  Notable for some degenerative arthritis.  This does  not seem to limit her ambulation much and she remains active physically.  Psychiatric:  Negative.  HEENT:  Negative.   PAST MEDICAL HISTORY:  1. 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  2. Mitral  regurgitation.  3. Hypertension.  4. Type 2 diabetes mellitus.  5. Hyperlipidemia.  6. Degenerative arthritis.  7. Iron-deficient anemia.  8. Paroxysmal atrial fibrillation, new onset.  9. Congestive heart failure, chronic systolic and diastolic.   PAST SURGICAL HISTORY:  1. Cholecystectomy.  2. Appendectomy.  3. Hysterectomy.  4. Bilateral total knee replacement.  5. Right total hip replacement.   FAMILY HISTORY:  Noncontributory.   SOCIAL HISTORY:  The patient is widowed and lives close by one of her 2  grown children in their family.  They live in a renovated barn in  [place], [place].  The patient is retired and previously worked in  an [employer].  She remains quite active physically and  continues to go to the [facility] for exercising and water aerobics 3 times a  week.  She is a nonsmoker.  She denies any alcohol consumption.   CURRENT MEDICATIONS:  1. Pravastatin 80 mg daily.  2. Glipizide 5 mg daily.  3. Poly-iron 150 mg daily.  4. Lisinopril 20 mg daily.  5. Verapamil 180 mg daily.  6. Jantoven 5 mg daily.  7. Metoprolol 50 mg daily.  8. Coumadin (dose uncertain).   DRUG ALLERGIES:  None known.   PHYSICAL EXAM:  GENERAL:  The patient is a well-appearing moderately  obese female who appears of stated age or somewhat younger and in no  acute distress.  VITAL SIGNS:  Blood pressure here in the office today is 182/91, pulse  62, and oxygen saturation 93% on room air.  HEENT:  Unrevealing.  NECK:  Supple.  There is no palpable cervical or supraclavicular  lymphadenopathy.  There is no jugular venous distention.  There are no  carotid bruits.  CHEST:  Auscultation of the chest demonstrates clear breath sounds,  which are symmetrical bilaterally.  No wheezes or rhonchi are noted.  CARDIOVASCULAR:  Regular rate and rhythm.  There is a grade 3/6 systolic  murmur heard along the sternal border with radiation towards the axilla.  No diastolic murmurs are  noted.  ABDOMEN:  Soft, nontender.  Bowel sounds are present.  There are no  obviously palpable masses.  EXTREMITIES:  Warm and adequately perfused.  There is mild bilateral  lower extremity edema.  Distal pulses are not palpable in either lower  leg or at the ankle.  RECTAL/GU:  Both deferred.  SKIN:  Clean, dry, and healthy appearing throughout.   DIAGNOSTIC TESTS:  A 2-D echocardiogram performed in September 2009 is  reviewed.  This demonstrates what appears to be fairly discrete area of  asymmetric septal hypertrophy with significant dynamic left ventricular  outflow tract obstruction.  There is at least moderate mitral  regurgitation.  There is normal left ventricular systolic function.  There is moderate left ventricular hypertrophy.  The aortic valve itself  appears to be tricuspid and functioning normally and without significant  aortic stenosis.  There does not appear to be aortic insufficiency.   Cardiac MRI is reviewed with similar findings.  The discrete area of  asymmetric septal hypertrophy is easily appreciated.  This should  be  characterized as a discrete area of left ventricular outflow tract  obstruction, and this is different from the patients with severe  hypertrophic obstructive cardiomyopathy afflicting the entire left  ventricle.  A band of muscle between the septum and the anterior leaflet  of the mitral valve was not clearly appreciated.  The size of the aortic  annulus is relatively small and the subvalvular left ventricular outflow  tract is quite narrow.  The left atrium is enlarged.   Cardiac catheterization performed in April 2009 is reviewed.  This  demonstrates long segment 40-45% stenosis of mid right coronary artery.  There is also tubular stenosis of mid left anterior descending coronary  artery, although this is probably less than 40%.  There are no other  significant areas of atherosclerosis appreciated.   IMPRESSION:  Severe dynamic left  ventricular outflow tract obstruction  with subvalvular stenosis and moderate-to-severe mitral regurgitation.  The patient is quite symptomatic with worsening symptoms of exertional  shortness of breath and recent onset of paroxysmal atrial fibrillation.  Risks associated with surgical intervention would be somewhat elevated  due to her advanced age.  However, the patient has remained remarkably  active physically up until recently, and she does not appear to have any  other significant comorbid medical problems that would preclude safe  surgical intervention.  It has been more than 9 months since her last  cardiac catheterization, and although she did not have high-grade  coronary artery disease, it is appreciated at that time she did have  significant plaque in both right coronary artery and left anterior  descending coronary artery.  I feel that repeat cardiac catheterization  would probably be wise before proceeding with elective surgical  intervention.  Right heart catheterization would be useful as well to  characterize severity of pulmonary hypertension.  Transesophageal  echocardiogram might be useful to further characterize this functional  anatomy of the mitral regurgitation, to make sure whether or not we  think that mitral valve repair will be feasible, and to get another look  at the dynamic left ventricular outflow tract obstruction.   PLAN:  I have discussed matters at length with the patient and her  friend here in the office today.  Alternative treatment strategies have  been discussed.  In particular, continue close medical followup versus  proceeding with surgical intervention have been reviewed.  All of her  questions have been addressed.  She desires to consider proceeding with  surgery in the near future.  As such, we will contact Dr. Al-Khori's  office to see if followup left and right heart catheterization and  possible transesophageal echocardiogram could be  scheduled.  We will  plan to see her back in 2 weeks.   Salvatore Decent. Cornelius Moras, M.D.  Electronically Signed   CHO/MEDQ  D:  02/02/2009  T:  02/03/2009  Job:  [id number]   cc:   Vick Frees, MD  Karie Soda. Joseph Art, M.D.

## 2011-05-10 NOTE — Assessment & Plan Note (Signed)
OFFICE VISIT   LAASIA, ARCOS  DOB:  1925-01-30                                        March 30, 2009  CHART #:  [id number]   The patient returns for routine followup, status post septal myectomy,  mitral valve repair, and modified Cox maze procedure on February 25, 2009,  for subvalvular left ventricular outflow tract obstruction with severe  mitral regurgitation and paroxysmal atrial fibrillation.  The patient's  early postoperative recovery was notable for the presence of  postoperative AV block requiring placement of permanent pacemaker.  She  otherwise did remarkably well and she ultimately was discharged home on  March 05, 2009.  Following then, the patient has continued to recover  remarkably well.  She has been seen in followup by Dr. Tereso Newcomer at  [facility], and her Coumadin dose has been adjusted and  monitored through their clinic.  Her dose of amiodarone has been cut  back to 200 mg twice daily.  The patient reports that she is doing  great.  She has had minimal soreness in her chest and she has not  required any pain medicine.  She has not had any shortness of breath and  in fact she states that her breathing is already better than it was  prior to surgery.  Her activity level is good.  She is eating well.  She  is sleeping well.  She has no complaints.   PHYSICAL EXAMINATION:  GENERAL:  A well-appearing female.  VITAL SIGNS:  Blood pressure 108/69, pulse 84, two-channel telemetry  rhythm strip demonstrates AV paced rhythm.  Oxygen saturation is 95% on  room air.  CHEST:  Median sternotomy incision that is healing nicely.  The sternum  is stable on palpation.  The small incision from pacemaker placement in  the left deltopectoral groove is also healing well.  There is no  surrounding pocket infection or hematoma.  LUNGS:  Auscultation of the chest demonstrates clear breath sounds which  are symmetrical bilaterally.  No wheezes or  rhonchi are demonstrated.  CARDIOVASCULAR:  Regular rate and rhythm.  No murmurs, rubs, or gallops  are appreciated.  ABDOMEN:  Soft and nontender.  EXTREMITIES:  Warm and well perfused.  There is moderate bilateral lower  extremity edema at the ankle.  The remainder of her physical exam is  unremarkable.   DIAGNOSTIC TEST:  Chest x-ray obtained today at the [facility] is reviewed.  This demonstrates clear lung fields with no pleural  effusions.  All the sternal wires appear intact.  The pacemaker leads  appear to be in normal position.  No other abnormalities are noted.   IMPRESSION:  The patient appears to be doing remarkably well and she  feels well and looks great.  She appears to be maintaining paced rhythm  with no signs of atrial fibrillation clinically.   PLAN:  I have encouraged the patient to continue to increase her  physical activity as tolerated with her only limitation at this point  remaining that she refrain from heavy lifting or strenuous use of her  arms or shoulders for at least another 2 months.  I think she can go  ahead and get started in the cardiac rehab program.  She is also asked  about water aerobics class, and I think this would be fine to get  started with.  Dr. Tereso Newcomer has made plans for tapering her amiodarone,  and when she has been off amiodarone, I think she could potentially come  off Coumadin if interrogation of her pacemaker demonstrates no further  signs of recurrent atrial fibrillation.  Overall, she looks terrific and  seems to be doing fine.  We will plan to see her back in 3 months' time  for routine followup.  At some point, a followup 2-D echocardiogram  might be appropriate under the circumstances.   Salvatore Decent. Cornelius Moras, M.D.  Electronically Signed   CHO/MEDQ  D:  03/30/2009  T:  03/31/2009  Job:  [id number]   cc:   Vick Frees, MD  Karie Soda. Joseph Art, M.D.

## 2011-05-10 NOTE — Discharge Summary (Signed)
[name], [name]            ACCOUNT NO.:  [id number]   MEDICAL RECORD NO.:  [id number]          PATIENT TYPE:  INP   LOCATION:  2016                         FACILITY:  [facility]   PHYSICIAN:  Salvatore Decent. Cornelius Moras, M.D. DATE OF BIRTH:  [birth year]   DATE OF ADMISSION:  02/25/2009  DATE OF DISCHARGE:  03/05/2009                               DISCHARGE SUMMARY   PRIMARY ADMITTING DIAGNOSES:  1. Mitral regurgitation.  2. Asymmetric septal hypertrophy with left ventricular outflow tract      obstruction.  3. Paroxysmal atrial fibrillation.   ADDITIONAL/DISCHARGE DIAGNOSES:  1. Mitral regurgitation.  2. Asymmetric septal hypertrophy with left ventricular outflow tract      obstruction.  3. Paroxysmal atrial fibrillation.  4. Postoperative atrioventricular block and left bundle branch block.  5. Mild postoperative renal insufficiency.  6. Mild acute postoperative blood loss anemia.  7. Hypertension.  8. Type 2 diabetes mellitus.  9. Hyperlipidemia.  10.History of iron-deficiency anemia as an outpatient.  11.Chronic congestive heart failure.  12.Arthritis.   PROCEDURES PERFORMED:  1. Septal myomectomy.  2. Mitral valve repair with Medtronic CG Future Band ring      annuloplasty.  3. Modified Cox maze procedure.  4. Placement of DDD permanent pacemaker.   HISTORY:  The patient is an 77 year old female who has been followed for  many years by Dr. Tereso Newcomer.  She has a history of hypertension and was  recently noted to have a heart murmur.  She also presented with  worsening symptoms of exertional shortness of breath, transient episodes  of generalized weakness and lightheadedness.  An echocardiogram showed  normal left ventricular systolic function with moderate left ventricular  hypertrophy and significant diastolic dysfunction.  There is dynamic  left ventricular outflow tract obstruction secondary to subvalvular  outflow tract obstruction with asymmetric septal hypertrophy and  systolic anterior motion of the mitral valve.  She underwent a cardiac  MRA, which confirmed presence of significant asymmetric septal  hypertrophy with dynamic left ventricular outflow tract obstruction.  She subsequently underwent a cardiac catheterization, which showed a  gradient across the left ventricular outflow tract, as well as moderate  pulmonary hypertension.  There was no significant coronary artery  disease.  Because of these findings, she was referred to Dr. Tressie Stalker for consideration of surgical intervention at this time.  Dr. Cornelius Moras  reviewed her studies and agreed that she would best be served by  elective septal myomectomy and mitral valve repair.  Also in light of  her long-standing history of atrial fibrillation, on chronic Coumadin,  it was recommended that she proceed with a maze procedure at the time of  surgery.  He explained all risks, benefits, and alternatives of surgery  to the patient, and she agreed to proceed.   HOSPITAL COURSE:  [name] was admitted to [facility] on  February 25, 2009, and underwent a septal myomectomy, mitral valve repair,  and Cox maze procedure as described above, performed by Dr. Cornelius Moras.  Please see dictated operative report for complete details of surgery.  She tolerated the procedure well and was transferred to the SICU  in  stable condition.  She was able to be extubated shortly after surgery.  She was hemodynamically stable and doing well on postop day #1.  At that  time, her tubes and lines were removed in the usual fashion, and she was  able to be transferred to the step-down unit.  Her postoperative course  has been complicated by second and third-degree A-V block requiring DDD  pacing.  This was treated conservatively initially and was observed  closely.  When she did not show significant improvement, an  Electrophysiology consult was obtained.  The patient was seen by Dr.  Graciela Husbands, and it was felt that she would benefit  from a DDD pacemaker  placement at this time.  This was performed on March 03, 2009, and her  pacer was interrogated the following morning.  Presently, her pacemaker  is functioning appropriately and she remains in rate-controlled atrial  fibrillation at present.  Also during her postoperative course, she has  had some mild renal insufficiency with creatinine as high as 1.7.  She  had been started on Lasix for mild postoperative volume overload but  this is currently being held secondary to her renal function.  Also, she  had been restarted on lisinopril for her blood pressure; however, again,  this is on hold at the present.  She has been restarted on Coumadin, and  her INR is trending upward.  She has been restarted on her home diabetes  medications, and her blood sugars have remained fairly stable.  Overall,  she is progressing well.  She is ambulating with cardiac rehab phase I  and is slowly progressing.  Her incisions are all healing well.  She  does have some lower extremity edema on physical exam and appears  clinically to be still mildly volume overloaded.  She has been afebrile,  and her vital signs have been stable with O2 sats greater than 90% on  room air.  Her most recent labs show a PT of 16.4, INR of 1.3; sodium  139, potassium 3.9 which has been replaced, BUN 40, creatinine 1.74, BNP  602; hemoglobin 10, hematocrit 29.2, white count 6.2, platelets 122.  She continues to make steady progress.  She will undergo a repeat BMET  on the morning of March 05, 2009.  It is anticipated that if she  continues to progress well, her renal function is improving and her  rhythm is stable, she will hopefully be ready for discharge home in the  next 24-48 hours.   DISCHARGE MEDICATIONS:  1. Aspirin 81 mg daily.  2. Ultram 50-100 mg q.4 h. p.r.n. for pain.  3. Glipizide 5 mg daily.  4. Amiodarone 400 mg b.i.d.  5. Pravastatin 80 mg daily.  6. Poly-iron 150 mg daily.  7. Coumadin final  dose will be determined by PT and INR drawn on the      date of discharge.  It will also be determined at that time of discharge whether she will be  restarted on Lasix and lisinopril.   DISCHARGE INSTRUCTIONS:  She is asked to refrain from driving, heavy  lifting, or strenuous activity.  She may continue ambulating daily and  using her incentive spirometer.  She may shower daily and clean her  incisions with soap and water.  She will continue low-fat, low-sodium,  carb-modified diet.   DISCHARGE FOLLOWUP:  She will need to make an appointment to see Dr. Mindi JunkerAne Payment in 2 weeks for followup.  She will also need to  have her INR  checked within 48 hours of discharge for Coumadin management.  She will  follow up with Dr. Cornelius Moras in 3 weeks with a chest x-ray.  She has been  given post pacer instructions by the Electrophysiology team from [facility]  [facility], and her pacer followup will be with Dr. Tereso Newcomer.  In the  interim, if she experiences problems or has questions, she is asked to  contact at the [facility] office.      Coral Ceo, P.A.      Salvatore Decent. Cornelius Moras, M.D.  Electronically Signed    GC/MEDQ  D:  03/04/2009  T:  03/04/2009  Job:  [id number]   cc:   Duke Salvia, MD, [facility]  Vick Frees, MD  Karie Soda. Joseph Art, M.D.

## 2011-05-10 NOTE — Assessment & Plan Note (Signed)
OFFICE VISIT   [name], [name]  DOB:  [birth year]                                        February 16, 2009  CHART #:  [id number]   The patient returns to the office today for followup related to  subvalvular left ventricular outflow tract obstruction and mitral  regurgitation.  She was originally seen in consultation on February 02, 2009.  Since then, she underwent followup left and right heart  catheterization by Dr. Tereso Newcomer on February 09, 2009.  By report,  catheterization revealed no progression of coronary artery disease with  only 50% stenosis of the right coronary artery and otherwise  insignificant coronary artery stenosis.  There was normal left  ventricular function.  There was a 20 mmHg gradient across the left  ventricular outflow tract consistent with the patient's known  subvalvular stenosis.  Pulmonary artery pressures were only mildly  elevated, but there was a large V wave consistent with severe mitral  regurgitation.   The patient returns to the office today with her family members to  discuss elective surgery further.  We have not yet had the opportunity  to directly review films of her recent catheterization, but the report  was discussed at length with the patient and her family.  The  indications, risks, and potential benefits of surgical intervention for  treatment of her subvalvular left ventricular outflow tract obstruction,  mitral regurgitation, and atrial fibrillation have been all discussed.  We planned to proceed with surgery on Wednesday February 25, 2009, for  septal myomectomy, mitral valve repair, and a maze procedure.  The  patient and her family understand and accept all associated risks of  surgery including but not limited to risk of death, stroke, myocardial  infarction, congestive heart failure, respiratory failure, pneumonia,  bleeding requiring blood transfusion, arrhythmia, heart block, or  bradycardia requiring  permanent pacemaker.  They also understand the  possible need for aortic valve replacement if the aortic valve is  damaged or becomes insufficient following septal myomectomy.  They  understand that we will plan to repair her mitral valve, but if valve  repair is not technically feasible, we would replace her valve using a  bioprosthetic tissue valve.  Based upon the nature of the procedure  particularly with the septal myomectomy, I do not feel that use of a  minimally invasive approach would be wise and we planned to proceed with  a conventional sternotomy.  However, we will use peripheral venous  cannulation to assist with optimal exposure of the mitral valve and  optimal lesion set creation for her maze procedure.  All of her  questions have been addressed.  I have given her prescription for  amiodarone 400 mg p.o. twice daily to begin 1 week prior to surgery.  She will stop her Coumadin at the time she starts amiodarone in  anticipation of her surgery.   Salvatore Decent. Cornelius Moras, M.D.  Electronically Signed   CHO/MEDQ  D:  02/16/2009  T:  02/16/2009  Job:  [id number]   cc:   Rosezena Sensor. Al-Khori, MD  Karie Soda. Joseph Art, M.D.

## 2011-05-10 NOTE — Op Note (Signed)
[name], [name]            ACCOUNT NO.:  [id number]   MEDICAL RECORD NO.:  [id number]          PATIENT TYPE:  INP   LOCATION:  2316                         FACILITY:  [facility]   PHYSICIAN:  Salvatore Decent. Cornelius Moras, M.D. DATE OF BIRTH:  03-01-1925   DATE OF PROCEDURE:  02/25/2009  DATE OF DISCHARGE:                               OPERATIVE REPORT   PREOPERATIVE DIAGNOSES:  1. Asymmetric septal hypertrophy with left ventricular outflow tract      obstruction.  2. Mitral regurgitation.  3. Paroxysmal atrial fibrillation.   POSTOPERATIVE DIAGNOSES:  1. Asymmetric septal hypertrophy with left ventricular outflow tract      obstruction.  2. Mitral regurgitation.  3. Paroxysmal atrial fibrillation.   PROCEDURE:  Median sternotomy for septal myomectomy, mitral valve repair  (Medtronic CG Futureband ring annuloplasty), and modified Cox maze  procedure (left side lesion set).   SURGEON:  Salvatore Decent. Cornelius Moras, MD   ASSISTANT:  Rowe Clack, PA-C   ANESTHESIA:  General.   BRIEF CLINICAL NOTE:  The patient is 77 year old female followed by Dr.  Tereso Newcomer for many years.  The patient has longstanding history of  hypertension as well as type 2 diabetes mellitus, hyperlipidemia, and  iron-deficient anemia.  The patient presents with worsening symptoms of  exertional shortness of breath.  Echocardiogram reveals normal left  ventricular systolic function with moderate left ventricular hypertrophy  and significant diastolic dysfunction.  There is dynamic left  ventricular outflow tract obstruction secondary to subvalvular outflow  tract obstruction with asymmetric septal hypertrophy with systolic  anterior motion of the mitral valve.  Cardiac MRI confirmed presence of  significant asymmetric septal hypertrophy with dynamic left ventricular  outflow tract obstruction.  Cardiac catheterization reveals a gradient  across the left ventricular outflow tract as well as no significant  coronary artery  disease and moderate pulmonary hypertension.  A full  consultation has been dictated previously.  The patient now presents for  elective surgical intervention.   OPERATIVE FINDINGS:  1. Normal left ventricular systolic function with moderate-to-severe      left ventricular hypertrophy and significant diastolic dysfunction.  2. Asymmetric septal hypertrophy with left ventricular outflow tract      obstruction.  3. Moderate mitral regurgitation.  4. Trivial residual mitral regurgitation following successful mitral      valve repair and septal myomectomy.  5. Markedly decreased left ventricular outflow tract obstruction      following successful septal myomectomy.   OPERATIVE NOTE IN DETAIL:  The patient was brought to the operating room  on the above-mentioned date and central monitoring was established by  the anesthesia team under the care and direction of Dr. Adonis Huguenin.  Specifically, a Swan-Ganz catheter was placed through the left internal  jugular approach.  A radial arterial line was placed.  Intravenous  antibiotics were administered.  Following induction with general  endotracheal anesthesia, a Foley catheter was placed.  The patient's  chest, abdomen, both groins, and both lower extremities were prepared  and draped in sterile manner.   Baseline transesophageal echocardiogram was performed by Dr. Krista Blue.  This confirms the presence of normal  left ventricular systolic function  with at least moderate left ventricular hypertrophy and significant  asymmetric septal hypertrophy with dynamic left ventricular outflow  tract obstruction.  There was at least moderate mitral regurgitation.  The mitral regurgitation consists of a broad central jet.  There was no  mitral valve prolapse.  There does not appear to be any significant  leaflet pathology.   A median sternotomy incision was performed.  The pericardium was opened.  The ascending aorta was mildly dilated, but otherwise  normal in  appearance.  The patient was heparinized systemically.  The ascending  aorta was cannulated just beyond the takeoff of the innominate artery.  A small stab incision was made in the right groin.  The right common  femoral vein was cannulated with a Seldinger technique.  A long flexible  guidewire was advanced up through the femoral vein, the inferior vena  cava through the right atrium into the superior vena cava using  transesophageal echocardiogram for guidance.  The femoral vein was then  dilated with serial dilators and a 22-French long femoral venous cannula  was advanced up over the guidewire and positioned with tip extending  through the right atrium into the superior vena cava.  A retrograde  cardioplegic cannula was placed through the right atrium into the  coronary sinus.   Cardiopulmonary bypass was begun.  A right angle metal tipped cannula  was placed directly in the superior vena cava for bicaval drainage.  An  antegrade cardioplegic cannula was placed in the ascending aorta.  A  temperature probe was placed in left ventricular septum.   The patient is cooled to 30 degrees systemic temperature.  The aortic  crossclamp was applied and cold blood cardioplegia delivered initially  in antegrade fashion through the aortic root.  Supplemental cardioplegia  was administered retrograde through the coronary sinus catheter.  Iced  saline slush was applied for topical hypothermia.  The initial  cardioplegic arrest, myocardial cooling was notably excellent.  Repeat  doses of cardioplegia were administered intermittently throughout the  crossclamp portion of the operation retrograde through the coronary  sinus catheter to maintain the left ventricular septal temperature below  15 degrees centigrade and completely flat electrocardiogram.   The heart was retracted towards the surgeon's side to expose the  reflection of the pericardium around the left-sided pulmonary veins.   The left-sided pulmonary veins were encircled, and the Medtronic  Cardioblate, irrigated radiofrequency bipolar ablation device was  utilized to create an elliptical ablation lesion around the base of the  left-sided pulmonary veins.  A similar lesion was created around the  base of the left atrial appendage.  The unipolar irrigated  radiofrequency ablation pen was then utilized to create a linear lesion  joining the ellipse surrounding the left-sided pulmonary veins with the  ellipse surrounding the base of the left atrial appendage.   The heart was replaced into the pericardium.  Left atriotomy incision  was performed posteriorly through the interatrial groove.  A left  ventricular vent was placed into the left ventricle.  An oblique  aortotomy incision was performed.  The aortic valve was inspected.  The  aortic valve was tricuspid.  There was some mild nodularity and  sclerosis of the valve, but the valve appears to be competent.  There  was no aortic stenosis.  The right cusp of the aortic valve was gently  retracted to expose the left ventricular outflow tract.  There was an  obvious hypertrophied interventricular septum with a  hypertrophied band  coming from the septum to the ventricular surface of the anterior  leaflet of the mitral valve as well.  This band was cut.  A malleable  retractor blade was then placed to cover the anterior leaflet of the  mitral valve and septal myomectomy was performed.  An 11-blade knife was  utilized to create a trough through the hypertrophied interventricular  septum.  The initial longitudinal incision was placed slightly towards  the commissure between the right and the noncoronary sinuses of Valsalva  in a vertical fashion.  A similar parallel cut was placed close to the  commissure between the left and right sinuses of Valsalva.  A transverse  incision was then utilized to join the two longitudinal cuts creating a  1.5-cm trough that extends  through the hypertrophied muscle band deep  into the left ventricular chamber.  The intervening muscle was removed  in a single specimen and sent to pathology for histology.  The  myomectomy channel was then carefully inspected and there appears to be  a broad channel with excellent result.  Of note, the asymmetric septal  hypertrophy was somewhat short as the patient does not have true severe  hypertrophic obstructive cardiomyopathy involving the rest of the  septum, but discrete knuckle of asymmetric septal hypertrophy that was  easily resected along with the associated muscle band to the anterior  leaflet of the mitral valve.   The floor of the left atrium was now exposed with a self-retaining  retractor.  The bipolar irrigated radiofrequency ablation device was  utilized to create an elliptical lesion around the right-sided pulmonary  veins with the atriotomy incision serving as the anterior half of the  ellipse.  One limb of the device was placed along the endocardial  surface and other along the epicardial surface for this lesion.  A  similar lesion was then created across the dome of the left atrium  extending from the cephalad apex of the atriotomy incision to reach the  cephalad apex of the elliptical lesion surrounding the left-sided  pulmonary veins.  A similar parallel lesion was placed from the caudad  apex of the atriotomy incision across the back wall of left atrium to  reach the caudad apex of the ellipse surrounding the left-sided  pulmonary veins.  Another bipolar lesion was then created from the  caudad apex of the atriotomy incision across the back wall of the left  atrium towards the mitral annulus.  This lesion was completed onto the  posterior mitral annulus using the unipolar irrigated radiofrequency  ablation pen along the endocardial surface.  This completes the left-  sided lesion set of the Cox maze procedure.   The left atrial appendage was oversewn from  within the left atrium using  a two-layer closure of running 3-0 Prolene suture.   The mitral valve was exposed using a self-retaining retractor.  The  mitral valve subvalvular apparatus appears normal.  There was no mitral  valve prolapse.  Functional anatomy was consistent only with annular  dilatation.  Posterior band annuloplasty was performed using interrupted  2-0 Ethibond horizontal mattress sutures placed around the entire  posterior annulus extending from the left fibrous trigone to the right  fibrous trigone.  The mitral valve was sized to accept a 28-mm  annuloplasty band.  A Medtronic CG Future band (serial number  [id number]) was secured in place uneventfully.  The valve was now tested  with saline and appears to be perfectly competent.  Rewarming  was begun.   A vent sucker was placed across the mitral valve to serve as a left  ventricular vent.  The left atriotomy incision was closed with a two-  layer closure of running 3-0 Prolene suture.  The aortic valve was again  inspected to make sure it has not been damaged, and the aortic valve was  then closed with a two-layer closure of running 4-0 Prolene suture.  One  final dose of warm retrograde hot shot cardioplegia was administered.  The patient was placed in Trendelenburg position.  The aortic crossclamp  was removed after total crossclamp time of 95 minutes.   The heart began to beat spontaneously without need for cardioversion.  The aortotomy incision and atriotomy incisions were both inspected for  meticulous hemostasis.  Epicardial pacing wires were fixed the right  ventricular free wall into the right atrial appendage.  The left  ventricular vent was removed.  The patient was rewarmed to 37 degrees  centigrade temperature.  The SVC cannula was removed and its cannulation  site oversewn.  The patient was weaned from cardiopulmonary bypass  without difficulty.  The patient's rhythm at separation from bypass was  a  slow junctional escape rhythm.  AV sequential pacing was employed.  Total cardiopulmonary bypass time for the operation was 127 minutes.   Followup transesophageal echocardiogram performed by Dr. Krista Blue after  separation from bypass demonstrates normal left ventricular function.  There was a well-seated annuloplasty band in the mitral position.  The  mitral valve was functioning normally and there was trivial residual  mitral regurgitation.  There was no sign of any systolic anterior motion  of the mitral valve.  There was no aortic insufficiency.  The left  ventricular outflow tract appears much more open with obvious broadening  and markedly decreased size of the asymmetric septal hypertrophy.   The aortic cannula was removed uneventfully.  Protamine was administered  to reverse the anticoagulation.  The femoral venous cannula was then  removed and manual pressure was hold on the groin for 30 minutes.  The  mediastinum was irrigated with saline solution.  Meticulous surgical  hemostasis was ascertained.  There was moderate coagulopathy.  The  patient was transfused 2 units of platelets and 2 units of fresh frozen  plasma.  The patient's mediastinum and right pleural space were drained  using three chest tubes exited through separate stab incisions  inferiorly.  The pericardium and soft tissues anterior to the aorta were  reapproximated loosely.  The sternum was closed with double-strength  sternal wire.  The soft tissues anterior to the sternum were closed in  multiple layers and the skin was closed with a running subcuticular skin  closure.   The patient tolerated the procedure well and was transported to the  surgical intensive care unit in stable condition.  There were no  intraoperative complications.  All sponge, instrument, and needle counts  were verified correct at completion of the operation.      Salvatore Decent. Cornelius Moras, M.D.  Electronically Signed     CHO/MEDQ  D:   02/25/2009  T:  02/26/2009  Job:  [id number]   cc:   Rosezena Sensor. Al-Khori, M.D.  Karie Soda. Joseph Art, M.D.

## 2011-05-10 NOTE — Op Note (Signed)
NAMEJANAISHA, [name] NO.:  [id number]   MEDICAL RECORD NO.:  [id number]          PATIENT TYPE:  INP   LOCATION:  2016                         FACILITY:  [facility]   PHYSICIAN:  Doylene Canning. Ladona Ridgel, MD    DATE OF BIRTH:  1925/10/06   DATE OF PROCEDURE:  03/03/2009  DATE OF DISCHARGE:                               OPERATIVE REPORT   PROCEDURE PERFORMED:  Implantation of a dual-chamber pacemaker.   INDICATIONS:  Symptomatic bradycardia with high-grade heart block  following a septal myomectomy and mitral valve repair.   INTRODUCTION:  The patient is an 77 year old woman who is admitted to  the hospital with mitral regurgitation and very severe asymmetric septal  hypertrophy with LV outflow tract obstruction.  She also had paroxysmal  AFib and she underwent successful septal myomectomy and mitral valve  repair along with modified Cox-maze procedure with left-sided lesions.  The patient postprocedure developed fairly high-grade heart block with  the PR interval of over 4 milliseconds and left bundle-branch block with  a QRS duration of nearly 200 milliseconds.  She is now referred for dual-  chamber pacemaker insertion after developing transient complete heart  block after her procedure.   PROCEDURE:  After informed consent was obtained, the patient was taken  to the diagnostic EP lab in the fasting state.  After usual preparation  and draping, intravenous fentanyl and midazolam was given for sedation.  A 30 mL of lidocaine was infiltrated in the left infraclavicular region.  A 5-cm incision was carried out over this region.  Electrocautery was  utilized to dissect down to the fascial plane.  The left subclavian vein  was subsequently punctured x2 and the Medtronic model 5076, 52-cm active  fixation pacing lead, serial #[id number] was advanced out to the RV  apex.  It should be noted that because of the patient's hypertrophic  cardiomyopathy, rather than placing the lead  on the RV septum, the RV  apex was chosen for lead placement.  In this location, the R-waves were  greater than 25 mV.  The pacing impedance was 800 ohms and threshold was  0.6 volts at 0.5 milliseconds once the lead was actively fixed.  A 10-  volt pacing did not stimulate the diaphragm and a prominent injury  current was noted.  With the ventricular lead in satisfactory position,  attention was then turned for placement of the atrial leads placed in  the antral portion of the right atrium where the P-waves measured 2 mV  and the pacing impedance was 470 ohms once the lead was actively fixed.  The pacing threshold 0.7 volts at 0.5 milliseconds.  A 10-volt pacing  did not stimulate the diaphragm.  There was a satisfactory though not  prominent injury current with active fixation of the lead.  Having  accomplished this, the leads were secured to the subpectoralis fascia  with a figure-of-eight silk suture.  The sewing sleeve was also secured  with silk suture.  Electrocautery was then utilized to make subcutaneous  pocket.  Kanamycin irrigation was utilized to irrigate the pocket and  electrocautery was utilized to assure hemostasis.  At this point, the  pocket was again irrigated with gentamicin and the incision was closed  with a layer of 2-0 Vicryl followed  by layer of 3-0 Vicryl.  Benzoin was painted on the skin.  Steri-Strips  were applied and the pressure dressing was placed, and the patient was  returned to her room in satisfactory condition.  Please note the  patient's pacemaker was a Medtronic Bandera serial #[id number].      Doylene Canning. Ladona Ridgel, MD  Electronically Signed     GWT/MEDQ  D:  03/03/2009  T:  03/04/2009  Job:  [id number]   cc:   Rosezena Sensor. Al-Khori, MD  Salvatore Decent. Cornelius Moras, M.D.

## 2011-05-10 NOTE — Assessment & Plan Note (Signed)
OFFICE VISIT   [name], [name]  DOB:  Jan 07, 1925                                        March 08, 2010  CHART #:  [id number]   HISTORY:  The patient returns for followup now 1 year status post septal  myomectomy, mitral valve repair, and maze procedure.  She was last seen  here in the office on October 12, 2009.  Since then, she reports that  she has continued to do fairly well.  She states that she has had some  increase in exertional fatigue.  She has not had problems with shortness  of breath.  She has not had any chest pain.  She has not had any tachy  palpitations or dizzy spells.  She states that she just seems to get  tired a little easier than she used to, but overall she has not had any  serious problems or complaints.  She denies any PND, orthopnea, or  syncopal episodes.  She does have chronic bilateral lower extremity  edema that waxes and wanes in severity.  The remainder of her review of  systems is unremarkable.  The remainder of her past medical history is  unchanged from previously.  She states that she had her pacemaker  interrogated over the telephone last week, but she has not yet heard of  the results.   CURRENT MEDICATIONS:  Pravastatin, glipizide, iron, lisinopril,  Jantoven, metoprolol, and aspirin.   PHYSICAL EXAMINATION:  Notable for a well-appearing obese female with  blood pressure 133/81, pulse 80 and regular.  Two-channel telemetry  rhythm strip demonstrates paced rhythm.  Auscultation of the chest  reveals clear breath sounds, which are symmetrical bilaterally.  No  wheezes or rhonchi are noted.  Cardiovascular exam demonstrates regular  rate and rhythm.  No murmurs, rubs, or gallops are appreciated.  The  abdomen is soft and nontender.  The extremities are warm and well  perfused.  There is moderate bilateral lower extremity edema at the  ankle.  The remainder of her physical exam is unrevealing.   IMPRESSION:  Overall,  the patient appears to be doing quite well.  She  states that over the last few months she has had some increasing  exertional fatigue.  She has not had any shortness of breath, orthopnea,  PND, chest pain, nor syncope.  She has not had any tachy palpitations.  She appears to be maintaining a paced rhythm.  She remains on Coumadin.   PLAN:  We will ask the patient to return to see [place] in 1 year's time.  We  will leave any subsequent decisions regarding whether or not to consider  stopping Coumadin therapy to the discretion of Dr. Tereso Newcomer as he has  access to results from interrogation of her indwelling pacemaker to see  whether or not she is having any asymptomatic periods of atrial  arrhythmias.  All of [name]'s questions have been addressed.   Salvatore Decent. Cornelius Moras, M.D.  Electronically Signed   CHO/MEDQ  D:  03/08/2010  T:  03/09/2010  Job:  [id number]   cc:   Vick Frees, MD  Barrett Shell. Joseph Art, MD

## 2011-05-13 NOTE — Discharge Summary (Signed)
NAMEDAJANAE, BROPHY            ACCOUNT NO.:  [id number]   MEDICAL RECORD NO.:  [id number]          PATIENT TYPE:  INP   LOCATION:  2021                         FACILITY:  [facility]   PHYSICIAN:  Salvatore Decent. Cornelius Moras, M.D. DATE OF BIRTH:  [birth year]   DATE OF ADMISSION:  02/25/2009  DATE OF DISCHARGE:  03/06/2009                               DISCHARGE SUMMARY   ADDENDUM   Ms. Felmlee continued to progress nicely.  She did require a couple  additional days of observation as well as continued diuresis.  Additionally, her Coumadin was restarted.  The pacemaker continued to  function nicely and was interrogated.  The underlying heart rhythm  beneath the pacer was an atrial fibrillation.  The electrophysiologist  agreed on March 06, 2009, that from there regard she was stable for  discharge.   Medications at time of discharge are as previously dictated.  The  Coumadin dose will be 5 mg daily and as directed.  Additionally, the  patient was given a prescription for 7 additional days of Lasix 40 mg  daily and K-Dur 20 mEq per day for 7 days.   For full details of this hospitalization, please see the previously  dictated summary.      Rowe Clack, P.A.-C.      Salvatore Decent. Cornelius Moras, M.D.  Electronically Signed    WEG/MEDQ  D:  04/01/2009  T:  04/02/2009  Job:  [id number]   cc:   Duke Salvia, MD, [facility]  Karie Soda. Joseph Art, M.D.

## 2015-10-10 ENCOUNTER — Encounter (HOSPITAL_BASED_OUTPATIENT_CLINIC_OR_DEPARTMENT_OTHER): Payer: Self-pay | Admitting: Emergency Medicine

## 2015-10-10 ENCOUNTER — Emergency Department (HOSPITAL_BASED_OUTPATIENT_CLINIC_OR_DEPARTMENT_OTHER)
Admission: EM | Admit: 2015-10-10 | Discharge: 2015-10-10 | Disposition: A | Discharge status: Home / Self Care | Payer: Medicare HMO | Attending: Physician Assistant | Admitting: Physician Assistant

## 2015-10-10 DIAGNOSIS — Z7901 Long term (current) use of anticoagulants: Secondary | ICD-10-CM | POA: Insufficient documentation

## 2015-10-10 DIAGNOSIS — M109 Gout, unspecified: Secondary | ICD-10-CM | POA: Insufficient documentation

## 2015-10-10 DIAGNOSIS — I10 Essential (primary) hypertension: Secondary | ICD-10-CM | POA: Insufficient documentation

## 2015-10-10 DIAGNOSIS — M545 Low back pain, unspecified: Secondary | ICD-10-CM

## 2015-10-10 DIAGNOSIS — Z950 Presence of cardiac pacemaker: Secondary | ICD-10-CM | POA: Diagnosis not present

## 2015-10-10 DIAGNOSIS — Z79899 Other long term (current) drug therapy: Secondary | ICD-10-CM | POA: Diagnosis not present

## 2015-10-10 DIAGNOSIS — E7800 Pure hypercholesterolemia, unspecified: Secondary | ICD-10-CM | POA: Insufficient documentation

## 2015-10-10 DIAGNOSIS — E119 Type 2 diabetes mellitus without complications: Secondary | ICD-10-CM | POA: Diagnosis not present

## 2015-10-10 DIAGNOSIS — Z7982 Long term (current) use of aspirin: Secondary | ICD-10-CM | POA: Insufficient documentation

## 2015-10-10 HISTORY — DX: Essential (primary) hypertension: I10

## 2015-10-10 HISTORY — DX: Type 2 diabetes mellitus without complications: E11.9

## 2015-10-10 HISTORY — DX: Presence of cardiac pacemaker: Z95.0

## 2015-10-10 HISTORY — DX: Pure hypercholesterolemia, unspecified: E78.00

## 2015-10-10 HISTORY — DX: Gout, unspecified: M10.9

## 2015-10-10 MED ORDER — CYCLOBENZAPRINE HCL 10 MG PO TABS: 10.0000 mg | ORAL_TABLET | Freq: Two times a day (BID) | ORAL | Status: AC | PRN

## 2015-10-10 MED ORDER — IBUPROFEN 800 MG PO TABS
800.0000 mg | ORAL_TABLET | Freq: Once | ORAL | Status: AC
  Administered 2015-10-10: 800 mg via ORAL
  Filled 2015-10-10: qty 1

## 2015-10-10 MED ORDER — OXYCODONE-ACETAMINOPHEN 5-325 MG PO TABS: 1.0000 | ORAL_TABLET | Freq: Four times a day (QID) | ORAL | Status: AC | PRN

## 2015-10-10 MED ORDER — CYCLOBENZAPRINE HCL 10 MG PO TABS
10.0000 mg | ORAL_TABLET | Freq: Once | ORAL | Status: AC
  Administered 2015-10-10: 10 mg via ORAL
  Filled 2015-10-10: qty 1

## 2015-10-10 NOTE — ED Provider Notes (Signed)
CSN: [id number]     Arrival date & time 10/10/15  0844 History   First MD Initiated Contact with Patient 10/10/15 [phone]     Chief Complaint  Patient presents with  . Back Pain     (Consider location/radiation/quality/duration/timing/severity/associated sxs/prior Treatment) HPI    Patient is a very pleasant 81 year old female presenting with back pain. Patient raked leaves on Tuesday and had back pain since then. Patient has no numbness no tingling. No trouble urinating. No fevers. No weakness. Patient has back pain that's all the way across her lower back.    Past Medical History  Diagnosis Date  . Hypertension   . Diabetes mellitus without complication (HCC)   . Hypercholesterolemia   . Gout   . Pacemaker    Past Surgical History  Procedure Laterality Date  . Hip arthroscopy     History reviewed. No pertinent family history. Social History  Substance Use Topics  . Smoking status: Never Smoker   . Smokeless tobacco: None  . Alcohol Use: No   OB History    No data available     Review of Systems  Constitutional: Negative for activity change.  Respiratory: Negative for shortness of breath.   Cardiovascular: Negative for chest pain.  Gastrointestinal: Negative for abdominal pain.  Genitourinary: Positive for flank pain. Negative for dysuria.  Musculoskeletal: Positive for back pain.  Neurological: Negative for weakness.  Psychiatric/Behavioral: Negative for confusion.      Allergies  Review of patient's allergies indicates no known allergies.  Home Medications   Prior to Admission medications   Medication Sig Start Date End Date Taking? Authorizing Provider  allopurinol (ZYLOPRIM) 100 MG tablet Take 100 mg by mouth daily.   Yes Historical Provider, MD  aspirin 81 MG tablet Take 81 mg by mouth daily.   Yes Historical Provider, MD  calcium acetate, Phos Binder, (PHOSLYRA) 667 MG/5ML SOLN Take by mouth 3 (three) times daily with meals.   Yes Historical  Provider, MD  calcium-vitamin D (OSCAL WITH D) 500-200 MG-UNIT tablet Take 2 tablets by mouth.   Yes Historical Provider, MD  furosemide (LASIX) 20 MG tablet Take 20 mg by mouth.   Yes Historical Provider, MD  glipiZIDE (GLUCOTROL XL) 10 MG 24 hr tablet Take 10 mg by mouth daily with breakfast.   Yes Historical Provider, MD  letrozole (FEMARA) 2.5 MG tablet Take 2.5 mg by mouth daily.   Yes Historical Provider, MD  lisinopril (PRINIVIL,ZESTRIL) 2.5 MG tablet Take 2.5 mg by mouth daily.   Yes Historical Provider, MD  metoprolol succinate (TOPROL-XL) 50 MG 24 hr tablet Take 50 mg by mouth daily. Take with or immediately following a meal.   Yes Historical Provider, MD  oxybutynin (DITROPAN) 5 MG tablet Take 5 mg by mouth 3 (three) times daily.   Yes Historical Provider, MD  pravastatin (PRAVACHOL) 80 MG tablet Take 80 mg by mouth daily.   Yes Historical Provider, MD  warfarin (COUMADIN) 5 MG tablet Take 5 mg by mouth daily.   Yes Historical Provider, MD   BP 132/88 mmHg  Pulse 85  Temp(Src) 98 F (36.7 C) (Oral)  Resp 18  SpO2 97% Physical Exam  Constitutional: She is oriented to person, place, and time. She appears well-developed and well-nourished.  HENT:  Head: Normocephalic and atraumatic.  Eyes: Right eye exhibits no discharge.  Cardiovascular: Normal rate.   Pulmonary/Chest: Effort normal.  Abdominal: Soft.  Musculoskeletal:  No tenderness to CT or L-spine. Patient able to lift her legs off  the bed bilaterally. Flexion and extension intact. Normal sensation. Patient is very good strength for her age.  Neurological: She is oriented to person, place, and time. No cranial nerve deficit.  Skin: Skin is warm and dry. She is not diaphoretic.  Psychiatric: She has a normal mood and affect.  Nursing note and vitals reviewed.   ED Course  Procedures (including critical care time) Labs Review Labs Reviewed - No data to display  Imaging Review No results found. I have personally  reviewed and evaluated these images and lab results as part of my medical decision-making.   EKG Interpretation None      MDM   Final diagnoses:  None    Patient is a very pleasant 81 year old female presenting with back pain. This in the setting of overdoing it while raking leaves on Tuesday. Patient has son that lives above her and son lives across the street. We will have her take one dose ibuprofen and Flexeril.. We will give her Percocet  take at home. She was warned that it'll make her dizzy and she will take it right before taking a nap or going to sleep. We'll have her follow-up with regular physician this week.  Not concerned about acute disc given that she has no numbness or tingling no neurologic symptoms and no weakness.    Psalms Olarte Randall AnLyn Caty Tessler, MD 10/10/15 [phone]

## 2015-10-10 NOTE — ED Notes (Signed)
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

## 2015-10-10 NOTE — Discharge Instructions (Signed)
Please follow up with your regular phsyician this week. If you have any numbness or weakness return immediately.   Back Exercises The following exercises strengthen the muscles that help to support the back. They also help to keep the lower back flexible. Doing these exercises can help to prevent back pain or lessen existing pain. If you have back pain or discomfort, try doing these exercises 2-3 times each day or as told by your health care provider. When the pain goes away, do them once each day, but increase the number of times that you repeat the steps for each exercise (do more repetitions). If you do not have back pain or discomfort, do these exercises once each day or as told by your health care provider. EXERCISES Single Knee to Chest Repeat these steps 3-5 times for each leg:  Lie on your back on a firm bed or the floor with your legs extended.  Bring one knee to your chest. Your other leg should stay extended and in contact with the floor.  Hold your knee in place by grabbing your knee or thigh.  Pull on your knee until you feel a gentle stretch in your lower back.  Hold the stretch for 10-30 seconds.  Slowly release and straighten your leg. Pelvic Tilt Repeat these steps 5-10 times:  Lie on your back on a firm bed or the floor with your legs extended.  Bend your knees so they are pointing toward the ceiling and your feet are flat on the floor.  Tighten your lower abdominal muscles to press your lower back against the floor. This motion will tilt your pelvis so your tailbone points up toward the ceiling instead of pointing to your feet or the floor.  With gentle tension and even breathing, hold this position for 5-10 seconds. Cat-Cow Repeat these steps until your lower back becomes more flexible:  Get into a hands-and-knees position on a firm surface. Keep your hands under your shoulders, and keep your knees under your hips. You may place padding under your knees for  comfort.  Let your head hang down, and point your tailbone toward the floor so your lower back becomes rounded like the back of a cat.  Hold this position for 5 seconds.  Slowly lift your head and point your tailbone up toward the ceiling so your back forms a sagging arch like the back of a cow.  Hold this position for 5 seconds. Press-Ups Repeat these steps 5-10 times:  Lie on your abdomen (face-down) on the floor.  Place your palms near your head, about shoulder-width apart.  While you keep your back as relaxed as possible and keep your hips on the floor, slowly straighten your arms to raise the top half of your body and lift your shoulders. Do not use your back muscles to raise your upper torso. You may adjust the placement of your hands to make yourself more comfortable.  Hold this position for 5 seconds while you keep your back relaxed.  Slowly return to lying flat on the floor. Bridges Repeat these steps 10 times:  Lie on your back on a firm surface.  Bend your knees so they are pointing toward the ceiling and your feet are flat on the floor.  Tighten your buttocks muscles and lift your buttocks off of the floor until your waist is at almost the same height as your knees. You should feel the muscles working in your buttocks and the back of your thighs. If you do  not feel these muscles, slide your feet 1-2 inches farther away from your buttocks.  Hold this position for 3-5 seconds.  Slowly lower your hips to the starting position, and allow your buttocks muscles to relax completely. If this exercise is too easy, try doing it with your arms crossed over your chest. Abdominal Crunches Repeat these steps 5-10 times:  Lie on your back on a firm bed or the floor with your legs extended.  Bend your knees so they are pointing toward the ceiling and your feet are flat on the floor.  Cross your arms over your chest.  Tip your chin slightly toward your chest without bending your  neck.  Tighten your abdominal muscles and slowly raise your trunk (torso) high enough to lift your shoulder blades a tiny bit off of the floor. Avoid raising your torso higher than that, because it can put too much stress on your low back and it does not help to strengthen your abdominal muscles.  Slowly return to your starting position. Back Lifts Repeat these steps 5-10 times: 1. Lie on your abdomen (face-down) with your arms at your sides, and rest your forehead on the floor. 2. Tighten the muscles in your legs and your buttocks. 3. Slowly lift your chest off of the floor while you keep your hips pressed to the floor. Keep the back of your head in line with the curve in your back. Your eyes should be looking at the floor. 4. Hold this position for 3-5 seconds. 5. Slowly return to your starting position. SEEK MEDICAL CARE IF:  Your back pain or discomfort gets much worse when you do an exercise.  Your back pain or discomfort does not lessen within 2 hours after you exercise. If you have any of these problems, stop doing these exercises right away. Do not do them again unless your health care provider says that you can. SEEK IMMEDIATE MEDICAL CARE IF:  You develop sudden, severe back pain. If this happens, stop doing the exercises right away. Do not do them again unless your health care provider says that you can.   This information is not intended to replace advice given to you by your health care provider. Make sure you discuss any questions you have with your health care provider.   Document Released: 01/19/2005 Document Revised: 09/02/2015 Document Reviewed: 02/05/2015 Elsevier Interactive Patient Education 2016 [place] Injury Prevention Back injuries can be very painful. They can also be difficult to heal. After having one back injury, you are more likely to injure your back again. It is important to learn how to avoid injuring or re-injuring your back. The following  tips can help you to prevent a back injury. WHAT SHOULD I KNOW ABOUT PHYSICAL FITNESS?  Exercise for 30 minutes per day on most days of the week or as directed by your health care provider. Make sure to:  Do aerobic exercises, such as walking, jogging, biking, or swimming.  Do exercises that increase balance and strength, such as tai chi and yoga. These can decrease your risk of falling and injuring your back.  Do stretching exercises to help with flexibility.  Try to develop strong abdominal muscles. Your abdominal muscles provide a lot of the support that is needed by your back.  Maintain a healthy weight. This helps to decrease your risk of a back injury. WHAT SHOULD I KNOW ABOUT MY DIET?  Talk with your health care provider about your overall diet. Take supplements and vitamins  only as directed by your health care provider.  Talk with your health care provider about how much calcium and vitamin D you need each day. These nutrients help to prevent weakening of the bones (osteoporosis). Osteoporosis can cause broken (fractured) bones, which lead to back pain.  Include good sources of calcium in your diet, such as dairy products, green leafy vegetables, and products that have had calcium added to them (fortified).  Include good sources of vitamin D in your diet, such as milk and foods that are fortified with vitamin D. WHAT SHOULD I KNOW ABOUT MY POSTURE?  Sit up straight and stand up straight. Avoid leaning forward when you sit or hunching over when you stand.  Choose chairs that have good low-back (lumbar) support.  If you work at a desk, sit close to it so you do not need to lean over. Keep your chin tucked in. Keep your neck drawn back, and keep your elbows bent at a right angle. Your arms should look like the letter "L."  Sit high and close to the steering wheel when you drive. Add a lumbar support to your car seat, if needed.  Avoid sitting or standing in one position for  very long. Take breaks to get up, stretch, and walk around at least one time every hour. Take breaks every hour if you are driving for long periods of time.  Sleep on your side with your knees slightly bent, or sleep on your back with a pillow under your knees. Do not lie on the front of your body to sleep. WHAT SHOULD I KNOW ABOUT LIFTING, TWISTING, AND REACHING? Lifting and Heavy Lifting  Avoid heavy lifting, especially repetitive heavy lifting. If you must do heavy lifting:  Stretch before lifting.  Work slowly.  Rest between lifts.  Use a tool such as a cart or a dolly to move objects if one is available.  Make several small trips instead of carrying one heavy load.  Ask for help when you need it, especially when moving big objects.  Follow these steps when lifting:  Stand with your feet shoulder-width apart.  Get as close to the object as you can. Do not try to pick up a heavy object that is far from your body.  Use handles or lifting straps if they are available.  Bend at your knees. Squat down, but keep your heels off the floor.  Keep your shoulders pulled back, your chin tucked in, and your back straight.  Lift the object slowly while you tighten the muscles in your legs, abdomen, and buttocks. Keep the object as close to the center of your body as possible.  Follow these steps when putting down a heavy load:  Stand with your feet shoulder-width apart.  Lower the object slowly while you tighten the muscles in your legs, abdomen, and buttocks. Keep the object as close to the center of your body as possible.  Keep your shoulders pulled back, your chin tucked in, and your back straight.  Bend at your knees. Squat down, but keep your heels off the floor.  Use handles or lifting straps if they are available. Twisting and Reaching  Avoid lifting heavy objects above your waist.  Do not twist at your waist while you are lifting or carrying a load. If you need to turn,  move your feet.  Do not bend over without bending at your knees.  Avoid reaching over your head, across a table, or for an object on a high  surface. WHAT ARE SOME OTHER TIPS?  Avoid wet floors and icy ground. Keep sidewalks clear of ice to prevent falls.  Do not sleep on a mattress that is too soft or too hard.  Keep items that are used frequently within easy reach.  Put heavier objects on shelves at waist level, and put lighter objects on lower or higher shelves.  Find ways to decrease your stress, such as exercise, massage, or relaxation techniques. Stress can build up in your muscles. Tense muscles are more vulnerable to injury.  Talk with your health care provider if you feel anxious or depressed. These conditions can make back pain worse.  Wear flat heel shoes with cushioned soles.  Avoid sudden movements.  Use both shoulder straps when carrying a backpack.  Do not use any tobacco products, including cigarettes, chewing tobacco, or electronic cigarettes. If you need help quitting, ask your health care provider.   This information is not intended to replace advice given to you by your health care provider. Make sure you discuss any questions you have with your health care provider.   Document Released: 01/19/2005 Document Revised: 04/28/2015 Document Reviewed: 12/16/2014 Elsevier Interactive Patient Education 2016 [employer]  Back Pain, Adult Back pain is very common in adults.The cause of back pain is rarely dangerous and the pain often gets better over time.The cause of your back pain may not be known. Some common causes of back pain include:  Strain of the muscles or ligaments supporting the spine.  Wear and tear (degeneration) of the spinal disks.  Arthritis.  Direct injury to the back. For many people, back pain may return. Since back pain is rarely dangerous, most people can learn to manage this condition on their own. HOME CARE INSTRUCTIONS Watch your back  pain for any changes. The following actions may help to lessen any discomfort you are feeling:  Remain active. It is stressful on your back to sit or stand in one place for long periods of time. Do not sit, drive, or stand in one place for more than 30 minutes at a time. Take short walks on even surfaces as soon as you are able.Try to increase the length of time you walk each day.  Exercise regularly as directed by your health care provider. Exercise helps your back heal faster. It also helps avoid future injury by keeping your muscles strong and flexible.  Do not stay in bed.Resting more than 1-2 days can delay your recovery.  Pay attention to your body when you bend and lift. The most comfortable positions are those that put less stress on your recovering back. Always use proper lifting techniques, including:  Bending your knees.  Keeping the load close to your body.  Avoiding twisting.  Find a comfortable position to sleep. Use a firm mattress and lie on your side with your knees slightly bent. If you lie on your back, put a pillow under your knees.  Avoid feeling anxious or stressed.Stress increases muscle tension and can worsen back pain.It is important to recognize when you are anxious or stressed and learn ways to manage it, such as with exercise.  Take medicines only as directed by your health care provider. Over-the-counter medicines to reduce pain and inflammation are often the most helpful.Your health care provider may prescribe muscle relaxant drugs.These medicines help dull your pain so you can more quickly return to your normal activities and healthy exercise.  Apply ice to the injured area:  Put ice in a plastic bag.  Place a towel between your skin and the bag.  Leave the ice on for 20 minutes, 2-3 times a day for the first 2-3 days. After that, ice and heat may be alternated to reduce pain and spasms.  Maintain a healthy weight. Excess weight puts extra stress on  your back and makes it difficult to maintain good posture. SEEK MEDICAL CARE IF:  You have pain that is not relieved with rest or medicine.  You have increasing pain going down into the legs or buttocks.  You have pain that does not improve in one week.  You have night pain.  You lose weight.  You have a fever or chills. SEEK IMMEDIATE MEDICAL CARE IF:   You develop new bowel or bladder control problems.  You have unusual weakness or numbness in your arms or legs.  You develop nausea or vomiting.  You develop abdominal pain.  You feel faint.   This information is not intended to replace advice given to you by your health care provider. Make sure you discuss any questions you have with your health care provider.   Document Released: 12/12/2005 Document Revised: 01/02/2015 Document Reviewed: 04/15/2014 Elsevier Interactive Patient Education [employer].

## 2020-04-25 DEATH — deceased
